# Patient Record
Sex: Female | Born: 1945
Health system: Southern US, Community
[De-identification: ages and names within clinical notes are randomized; demographics above are authoritative.]

## PROBLEM LIST (undated history)

## (undated) DIAGNOSIS — H9190 Unspecified hearing loss, unspecified ear: Secondary | ICD-10-CM

## (undated) DIAGNOSIS — M8580 Other specified disorders of bone density and structure, unspecified site: Secondary | ICD-10-CM

## (undated) DIAGNOSIS — K219 Gastro-esophageal reflux disease without esophagitis: Secondary | ICD-10-CM

## (undated) DIAGNOSIS — D126 Benign neoplasm of colon, unspecified: Secondary | ICD-10-CM

## (undated) DIAGNOSIS — E785 Hyperlipidemia, unspecified: Secondary | ICD-10-CM

## (undated) DIAGNOSIS — I10 Essential (primary) hypertension: Secondary | ICD-10-CM

## (undated) DIAGNOSIS — F329 Major depressive disorder, single episode, unspecified: Secondary | ICD-10-CM

## (undated) DIAGNOSIS — R159 Full incontinence of feces: Secondary | ICD-10-CM

## (undated) DIAGNOSIS — R519 Headache, unspecified: Secondary | ICD-10-CM

## (undated) DIAGNOSIS — R112 Nausea with vomiting, unspecified: Secondary | ICD-10-CM

## (undated) DIAGNOSIS — F32A Depression, unspecified: Secondary | ICD-10-CM

## (undated) DIAGNOSIS — R109 Unspecified abdominal pain: Secondary | ICD-10-CM

## (undated) DIAGNOSIS — R51 Headache: Secondary | ICD-10-CM

## (undated) DIAGNOSIS — Z8379 Family history of other diseases of the digestive system: Secondary | ICD-10-CM

## (undated) DIAGNOSIS — Z9889 Other specified postprocedural states: Secondary | ICD-10-CM

## (undated) DIAGNOSIS — K648 Other hemorrhoids: Secondary | ICD-10-CM

## (undated) HISTORY — DX: Unspecified abdominal pain: R10.9

## (undated) HISTORY — DX: Other specified disorders of bone density and structure, unspecified site: M85.80

## (undated) HISTORY — DX: Essential (primary) hypertension: I10

## (undated) HISTORY — DX: Major depressive disorder, single episode, unspecified: F32.9

## (undated) HISTORY — DX: Hyperlipidemia, unspecified: E78.5

## (undated) HISTORY — DX: Family history of other diseases of the digestive system: Z83.79

## (undated) HISTORY — DX: Depression, unspecified: F32.A

## (undated) HISTORY — DX: Benign neoplasm of colon, unspecified: D12.6

## (undated) HISTORY — DX: Gastro-esophageal reflux disease without esophagitis: K21.9

## (undated) HISTORY — DX: Unspecified hearing loss, unspecified ear: H91.90

## (undated) HISTORY — PX: BREAST EXCISIONAL BIOPSY: SUR124

## (undated) HISTORY — DX: Other hemorrhoids: K64.8

## (undated) HISTORY — DX: Headache, unspecified: R51.9

## (undated) HISTORY — DX: Other specified postprocedural states: Z98.890

## (undated) HISTORY — DX: Nausea with vomiting, unspecified: R11.2

## (undated) HISTORY — DX: Full incontinence of feces: R15.9

## (undated) HISTORY — DX: Headache: R51

---
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
## 1974-06-22 HISTORY — PX: TUBAL LIGATION: SHX77

## 1984-06-22 DIAGNOSIS — Z9889 Other specified postprocedural states: Secondary | ICD-10-CM

## 1984-06-22 DIAGNOSIS — R112 Nausea with vomiting, unspecified: Secondary | ICD-10-CM

## 1984-06-22 HISTORY — DX: Nausea with vomiting, unspecified: R11.2

## 1984-06-22 HISTORY — DX: Other specified postprocedural states: Z98.890

## 1987-06-23 HISTORY — PX: ABDOMINAL HYSTERECTOMY: SHX81

## 1996-06-22 HISTORY — PX: ROTATOR CUFF REPAIR: SHX139

## 1999-06-10 ENCOUNTER — Encounter: Admission: RE | Admit: 1999-06-10 | Discharge: 1999-06-10 | Payer: Self-pay | Admitting: *Deleted

## 1999-06-10 ENCOUNTER — Encounter: Payer: Self-pay | Admitting: *Deleted

## 1999-08-24 ENCOUNTER — Inpatient Hospital Stay (HOSPITAL_COMMUNITY): Admission: EM | Admit: 1999-08-24 | Discharge: 1999-08-31 | Payer: Self-pay | Admitting: Emergency Medicine

## 1999-08-24 ENCOUNTER — Encounter: Payer: Self-pay | Admitting: Emergency Medicine

## 1999-08-24 HISTORY — PX: EXPLORATORY LAPAROTOMY: SUR591

## 2000-02-13 ENCOUNTER — Other Ambulatory Visit: Admission: RE | Admit: 2000-02-13 | Discharge: 2000-02-13 | Payer: Self-pay | Admitting: *Deleted

## 2000-06-11 ENCOUNTER — Encounter: Payer: Self-pay | Admitting: *Deleted

## 2000-06-11 ENCOUNTER — Encounter: Admission: RE | Admit: 2000-06-11 | Discharge: 2000-06-11 | Payer: Self-pay | Admitting: *Deleted

## 2000-11-05 ENCOUNTER — Encounter (INDEPENDENT_AMBULATORY_CARE_PROVIDER_SITE_OTHER): Payer: Self-pay | Admitting: *Deleted

## 2000-11-05 ENCOUNTER — Other Ambulatory Visit: Admission: RE | Admit: 2000-11-05 | Discharge: 2000-11-05 | Payer: Self-pay | Admitting: Internal Medicine

## 2001-04-13 ENCOUNTER — Encounter: Payer: Self-pay | Admitting: Family Medicine

## 2001-04-13 ENCOUNTER — Encounter: Admission: RE | Admit: 2001-04-13 | Discharge: 2001-04-13 | Payer: Self-pay | Admitting: Family Medicine

## 2001-06-30 ENCOUNTER — Encounter: Payer: Self-pay | Admitting: *Deleted

## 2001-06-30 ENCOUNTER — Encounter: Admission: RE | Admit: 2001-06-30 | Discharge: 2001-06-30 | Payer: Self-pay | Admitting: *Deleted

## 2002-09-12 ENCOUNTER — Encounter: Payer: Self-pay | Admitting: Family Medicine

## 2002-09-12 ENCOUNTER — Encounter: Admission: RE | Admit: 2002-09-12 | Discharge: 2002-09-12 | Payer: Self-pay | Admitting: Family Medicine

## 2003-11-15 ENCOUNTER — Encounter: Admission: RE | Admit: 2003-11-15 | Discharge: 2003-11-15 | Payer: Self-pay | Admitting: Family Medicine

## 2004-11-19 ENCOUNTER — Encounter: Admission: RE | Admit: 2004-11-19 | Discharge: 2004-11-19 | Payer: Self-pay | Admitting: Family Medicine

## 2005-02-02 ENCOUNTER — Encounter: Admission: RE | Admit: 2005-02-02 | Discharge: 2005-02-02 | Payer: Self-pay | Admitting: Family Medicine

## 2005-02-26 ENCOUNTER — Ambulatory Visit (HOSPITAL_COMMUNITY): Admission: RE | Admit: 2005-02-26 | Discharge: 2005-02-26 | Payer: Self-pay | Admitting: Interventional Cardiology

## 2005-06-22 HISTORY — PX: CHOLECYSTECTOMY: SHX55

## 2005-12-17 ENCOUNTER — Encounter: Admission: RE | Admit: 2005-12-17 | Discharge: 2005-12-17 | Payer: Self-pay | Admitting: Family Medicine

## 2006-12-06 ENCOUNTER — Ambulatory Visit: Payer: Self-pay | Admitting: Internal Medicine

## 2007-01-19 ENCOUNTER — Ambulatory Visit: Payer: Self-pay | Admitting: Internal Medicine

## 2007-03-25 ENCOUNTER — Encounter: Admission: RE | Admit: 2007-03-25 | Discharge: 2007-03-25 | Payer: Self-pay | Admitting: Family Medicine

## 2007-09-22 ENCOUNTER — Encounter: Admission: RE | Admit: 2007-09-22 | Discharge: 2007-09-22 | Payer: Self-pay | Admitting: Family Medicine

## 2007-11-07 DIAGNOSIS — E785 Hyperlipidemia, unspecified: Secondary | ICD-10-CM | POA: Insufficient documentation

## 2007-11-07 DIAGNOSIS — I10 Essential (primary) hypertension: Secondary | ICD-10-CM | POA: Insufficient documentation

## 2007-11-07 DIAGNOSIS — Z9189 Other specified personal risk factors, not elsewhere classified: Secondary | ICD-10-CM | POA: Insufficient documentation

## 2008-03-29 ENCOUNTER — Encounter: Admission: RE | Admit: 2008-03-29 | Discharge: 2008-03-29 | Payer: Self-pay | Admitting: Family Medicine

## 2008-08-06 ENCOUNTER — Telehealth: Payer: Self-pay | Admitting: Internal Medicine

## 2008-08-07 ENCOUNTER — Ambulatory Visit: Payer: Self-pay | Admitting: Gastroenterology

## 2008-08-07 DIAGNOSIS — K5730 Diverticulosis of large intestine without perforation or abscess without bleeding: Secondary | ICD-10-CM | POA: Insufficient documentation

## 2008-08-07 DIAGNOSIS — R1319 Other dysphagia: Secondary | ICD-10-CM | POA: Insufficient documentation

## 2008-08-07 DIAGNOSIS — Z860100 Personal history of colon polyps, unspecified: Secondary | ICD-10-CM | POA: Insufficient documentation

## 2008-08-07 DIAGNOSIS — K219 Gastro-esophageal reflux disease without esophagitis: Secondary | ICD-10-CM | POA: Insufficient documentation

## 2008-08-07 DIAGNOSIS — R110 Nausea: Secondary | ICD-10-CM | POA: Insufficient documentation

## 2008-08-10 ENCOUNTER — Telehealth: Payer: Self-pay | Admitting: Internal Medicine

## 2008-08-14 ENCOUNTER — Ambulatory Visit: Payer: Self-pay | Admitting: Internal Medicine

## 2008-08-24 ENCOUNTER — Ambulatory Visit (HOSPITAL_COMMUNITY): Admission: RE | Admit: 2008-08-24 | Discharge: 2008-08-24 | Payer: Self-pay | Admitting: Interventional Cardiology

## 2008-10-08 ENCOUNTER — Ambulatory Visit (HOSPITAL_COMMUNITY): Admission: RE | Admit: 2008-10-08 | Discharge: 2008-10-08 | Payer: Self-pay | Admitting: Gastroenterology

## 2008-11-08 ENCOUNTER — Ambulatory Visit (HOSPITAL_COMMUNITY): Admission: RE | Admit: 2008-11-08 | Discharge: 2008-11-08 | Payer: Self-pay | Admitting: General Surgery

## 2008-11-08 ENCOUNTER — Encounter (INDEPENDENT_AMBULATORY_CARE_PROVIDER_SITE_OTHER): Payer: Self-pay | Admitting: General Surgery

## 2009-01-17 ENCOUNTER — Encounter: Admission: RE | Admit: 2009-01-17 | Discharge: 2009-01-17 | Payer: Self-pay | Admitting: Gastroenterology

## 2009-01-31 ENCOUNTER — Ambulatory Visit (HOSPITAL_COMMUNITY): Admission: RE | Admit: 2009-01-31 | Discharge: 2009-01-31 | Payer: Self-pay | Admitting: Gastroenterology

## 2009-04-10 ENCOUNTER — Encounter: Admission: RE | Admit: 2009-04-10 | Discharge: 2009-04-10 | Payer: Self-pay | Admitting: Family Medicine

## 2010-04-14 ENCOUNTER — Encounter: Admission: RE | Admit: 2010-04-14 | Discharge: 2010-04-14 | Payer: Self-pay | Admitting: Family Medicine

## 2010-04-22 ENCOUNTER — Encounter: Admission: RE | Admit: 2010-04-22 | Discharge: 2010-04-22 | Payer: Self-pay | Admitting: Family Medicine

## 2010-07-13 ENCOUNTER — Encounter: Payer: Self-pay | Admitting: Interventional Cardiology

## 2010-09-30 LAB — CBC
HCT: 38.4 % (ref 36.0–46.0)
MCV: 98.5 fL (ref 78.0–100.0)
RBC: 3.9 MIL/uL (ref 3.87–5.11)
WBC: 6.7 10*3/uL (ref 4.0–10.5)

## 2010-09-30 LAB — DIFFERENTIAL
Eosinophils Absolute: 0.1 10*3/uL (ref 0.0–0.7)
Eosinophils Relative: 2 % (ref 0–5)
Lymphs Abs: 1.5 10*3/uL (ref 0.7–4.0)
Monocytes Relative: 4 % (ref 3–12)

## 2010-09-30 LAB — BASIC METABOLIC PANEL
Chloride: 110 mEq/L (ref 96–112)
GFR calc Af Amer: >60 mL/min (ref >60)
Potassium: 4.3 mEq/L (ref 3.5–5.1)

## 2010-10-01 ENCOUNTER — Other Ambulatory Visit: Payer: Self-pay | Admitting: Family Medicine

## 2010-10-01 DIAGNOSIS — Z09 Encounter for follow-up examination after completed treatment for conditions other than malignant neoplasm: Secondary | ICD-10-CM

## 2010-10-23 ENCOUNTER — Ambulatory Visit
Admission: RE | Admit: 2010-10-23 | Discharge: 2010-10-23 | Disposition: A | Discharge status: Home / Self Care | Payer: Medicare HMO | Source: Ambulatory Visit | Attending: Family Medicine | Admitting: Family Medicine

## 2010-10-23 ENCOUNTER — Other Ambulatory Visit: Payer: Self-pay | Admitting: Family Medicine

## 2010-10-23 ENCOUNTER — Other Ambulatory Visit: Payer: Self-pay | Admitting: Diagnostic Radiology

## 2010-10-23 DIAGNOSIS — Z09 Encounter for follow-up examination after completed treatment for conditions other than malignant neoplasm: Secondary | ICD-10-CM

## 2010-11-04 NOTE — Op Note (Signed)
Rachael Webster, Rachael Webster             ACCOUNT NO.:  [id number]   MEDICAL RECORD NO.:  [id number]          PATIENT TYPE:  AMB   LOCATION:  DAY                          FACILITY:  [facility]   PHYSICIAN:  Lennie Muckle, MD      DATE OF BIRTH:  February 26, 1946   DATE OF PROCEDURE:  11/08/2008  DATE OF DISCHARGE:                               OPERATIVE REPORT   PREOPERATIVE DIAGNOSIS:  Biliary dyskinesia.   POSTOPERATIVE DIAGNOSIS:  Biliary dyskinesia.   PROCEDURE:  Laparoscopic cholecystectomy.   SURGEON:  Bertram Savin, M.D.   ASSISTANT:  Anselm Pancoast. Zachery Dakins, M.D.   FINDINGS:  Adhesions to abdominal wall, adhesions to the gallbladder.   INDICATIONS FOR PROCEDURE:  Ms. Harms is a 65 year old female I had  seen for evaluation of epigastric and right upper quadrant pain.  She  had a previous history of a perforated ulcer.  She had endoscopy that  was normal.  Ultrasound was normal, but she did have a HIDA scan, with  an ejection fraction of approximately 16%.  Her symptoms did seem  consistent with biliary dyskinesia.  I had talked with Ms. Schooley about  a laparoscopic cholecystectomy, with a slight risk of an open procedure,  due to a previous perforated ulcer.  An informed consent was obtained  prior to procedure.   DESCRIPTION OF PROCEDURE:  Ms. Myhre was identified in the  preoperative holding area. All questions were answered.  She did receive  2 grams of cefoxitin.  She was then taken to the operating room.  Once  in the operating room, placed in the supine position.  Spontaneous  compression devices were applied to the bilateral lower extremity.  She  was placed under general endotracheal anesthesia.  Her abdomen was  prepped and draped in the usual sterile fashion.  A time out for the  procedure indicating the procedure and the patient was performed.  An  incision was placed in the right upper quadrant.  Using a 5 mm trocar  and the Opti-Vu, I placed the camera into the abdominal  cavity.  All  layers of abdominal wall were visualized upon entry.  I inspected the  abdomen.  There seemed to be no evidence of injury upon placement of the  trocar.  There were adhesions to the abdominal wall at the midline and  just inferior to the port placement.  The umbilical area was free of  adhesions.  We placed a 5 mm trocar at the umbilical region. There were  also adhesions at the epigastric region.  These were partially taken  down with laparoscopic scissors.  Total time was approximately 10  minutes.  A 5 mm trocar was placed in the right upper quadrant under  visualization with the camera and an 11 mm trocar was placed in the  epigastric region.  The gallbladder was identified in normal anatomic  position.  There were omental adhesions to the gallbladder.  I dissected  with blunt dissection and sharp dissection.  I was able to obtain a  critical view of the cystic duct and the artery.  I clipped and divided  the cystic duct.  I carried the dissection superiorly and clipped and  divided the cystic artery.  The gallbladder was placed in an EndoCatch  bag.  A small amount of bile spillage.  I placed two Ray-Tec's in the  abdominal cavity around the area of dissetion.  After removal of the  gallbladder, I removed the Ray-Tec's and inspected the liver.  There was  no evidence of bleeding.  No evidence of bile leakage.  The clips were  in place on the cystic duct.  I then closed the epigastric port after  removing the trocar with a 0 Vicryl suture.  The pneumoperitoneum was  released after final inspection of the abdomen and revealed no bleeding,  no injury.  The trocars were removed.  The skin was closed with 4-0  Monocryl.  Dermabond was placed, final dressing.   The patient was extubated and transferred to the post-anesthesia care  unit in stable condition.  She will be monitored overnight and likely  discharged home tomorrow.      Lennie Muckle, MD  Electronically  Signed     ALA/MEDQ  D:  11/08/2008  T:  11/08/2008  Job:  [id number]   cc:   Ursula Beath, MD  Fax: [phone]   Danise Edge, M.D.  Fax: [phone]

## 2010-11-04 NOTE — Assessment & Plan Note (Signed)
[facility]                         GASTROENTEROLOGY OFFICE NOTE   KALEENA, CORROW                    MRN:          [id number]  DATE:12/06/2006                            DOB:          [birth year]    The patient is self referred.   REASON FOR CONSULTATION:  Change in bowel habits and surveillance  colonoscopy.   HISTORY:  This is a 64 year old white female with a history of  hypertension, dyslipidemia, and chronic headaches.  She also has a  history of perforated gastric ulcer for which she is status post  surgical repair.  Other surgeries include hysterectomy, rotator cuff  repair, tubal ligation, tonsillectomy, and breast surgery.  She also has  a history of adenomatous colon polyps.  Her index colonoscopy was  performed in 2002.  Followup was performed in 2004 with diminutive polyp  only.  Followup in 3 years recommended.  A letter was sent out last  year.  The patient was somewhat concerned about 2-3 weeks ago when she  had change in her bowel movements.  She reports that they were more  frequent and somewhat loose or less formed.  There was no associated  nausea and vomiting, abdominal pain or bleeding.  Her weight has been  stable.  Her bowel habits have now reverted back to normal, without  incident.  She denies any change in diet or other problems at that time.   PAST MEDICAL HISTORY:  As discussed above.   FAMILY HISTORY:  Unknown.  The patient is adopted.   ALLERGIES:  The patient is intolerant to ASPIRIN, IBUPROFEN, and ELAVIL.   CURRENT MEDICATIONS:  1. Fosamax 70 mg weekly.  2. Amlodipine 10 mg daily.  3. Omeprazole 20 mg daily.  4. Lescol XR 80 mg daily.  5. Zetia 10 mg daily.  6. Doxepin 150 mg at night.  7. Effexor 150 mg daily.  8. Hydrocodone p.r.n.  9. Multivitamin.  10.Calcium, vitamin D, and fish oil.  11.She also takes Ambien p.r.n.   SOCIAL HISTORY:  The patient continues to work as a Psychologist, forensic for  [employer].  She is married with 2 children, does not smoke or use  alcohol.   PHYSICAL EXAMINATION:  Well appearing female in no acute distress.  Blood pressure is 118/72, heart rate is 80, weight is 161 pounds.  She  is 5 feet 6 inches in height.  HEENT:  Sclerae are anicteric, conjunctiva are pink, oral mucosa intact.  LUNGS:  Clear.  HEART:  Regular.  ABDOMEN:  Soft, without tenderness, mass or hernia.  EXTREMITIES:  Without edema.   IMPRESSION:  1. Nonspecific transient change in bowel habits.  No worrisome      features.  Reassurance provided.  2. History of adenomatous colon polyps due for surveillance.  3. History of diverticulosis.   RECOMMENDATIONS:  Schedule colonoscopy, with polypectomy if needed.  Ashby Dawes of the procedure as well as the risks, benefits and alternatives  were reviewed.  She understood and agreed to proceed.     Wilhemina Bonito. Marina Goodell, MD  Electronically Signed    JNP/MedQ  DD: 12/06/2006  DT:  12/06/2006  Job #: [id number]   cc:   Talmadge Coventry, M.D.

## 2010-11-07 NOTE — Discharge Summary (Signed)
[place]. [facility]  Patient:    Rachael Webster, Rachael Webster                      MRN: [id number] Adm. Date:  [id number] Disc. Date: [id number] Attending:  Brandy Hale CC:         Clabe Seal. Meryl Crutch, M.D.             Talmadge Coventry, M.D.             Leatha Gilding. Mezer, M.D.                           Discharge Summary  FINAL DIAGNOSES: 1. Perforated duodenal ulcer. 2. Migraine headaches.  OPERATION PERFORMED:  Exploratory laparotomy and Cheree Ditto patch closure of perforated duodenal ulcer.  BRIEF HISTORY:  This is a 65 year old, white female with chronic migraine headaches on multiple medications.  She takes dexamethasone, aspirin, Indocin, doxepin, Effexor, Norvasc, and Fosamax.  She had a sudden onset of epigastric pain at 12:30 p.m. on the day of admission, and this became more severe.  She came to the emergency room and was evaluated by the emergency department physician.  The emergency department physician obtained a CT scan, which showed free intraperitoneal air and probable contrast in the free peritoneal cavity.  A normal CT was noted.  I was asked to see the patient at that time.  PHYSICAL EXAMINATION:  GENERAL:  Pale, alert, and cooperative white female.  VITAL SIGNS:  Temperature 98.4, heart rate 87, respiratory rate 16, and blood pressure 124/83.  HEENT:  Sclera nonicteric.  Extraocular movements intact.  NECK:  Supple and nontender.  No mass.  No crepitants.  No swelling.  LUNGS:  Clear to auscultation.  HEART:  Regular rate and rhythm.  No murmur.  ABDOMEN:  Silent.  She was extremely tender in the epigastrium with involuntary guarding and rebound tenderness.  The lower abdomen was fairly soft.  There was no mass or hernia.  A well-healed Pfannenstiel incision.  ADMISSION LABORATORY DATA:  Hemoglobin 15.7 and white count 19,600.  Glucose 180, sodium 136, potassium 3.5, and BUN 20.  Amylase 106 and lipase 88.  HOSPITAL COURSE:   Following evaluation in the emergency room, the patient received IV fluid resuscitation and broad-spectrum antibiotics and was taken to the operating room.  She was found to have a perforated duodenal ulcer. This was repaired with a Cheree Ditto patch closure, and the abdomen was irrigated.  Postoperatively, she did very well.  She was maintained on broad-spectrum antibiotics.  She had an ileus for several days, but that ultimately resolved. She progressed in her activities, and ultimately began to tolerate a diet on August 29, 1999.  She progressed her diet and activities slowly thereafter, and was able to be discharged on August 31, 1999.  At that time, she was afebrile, tolerating diet, had had bowel movements, and her wounds were healing uneventfully without signs of infection.  DISPOSITION:  She was discharged home.  DISCHARGE MEDICATIONS:  She was given a prescription for analgesics for pain.  DISCHARGE INSTRUCTIONS:  She was told not to take aspirin, Indocin, or dexamethasone again.  FOLLOWUP:  She was to follow up with Dr. Clabe Seal. Adelman regarding alternative forms of treatment for her migraine headaches.  She was asked to return to see me in the office in 2-3 weeks. DD:  09/19/99 TD:  09/20/99 Job: [id number]

## 2010-11-07 NOTE — Op Note (Signed)
[place]. [facility]  Patient:    Rachael Webster, Rachael Webster                      MRN: [id number] Proc. Date: 08/24/99 Adm. Date:  [id number] Attending:  Brandy Hale CC:         Talmadge Coventry, M.D.             Clabe Seal. Meryl Crutch, M.D.             Leatha Gilding. Mezer, M.D.                           Operative Report  PREOPERATIVE DIAGNOSIS:  Perforated duodenal ulcer.  POSTOPERATIVE DIAGNOSIS:  Perforated duodenal ulcer.  OPERATION PERFORMED:  Exploratory laparotomy, Cheree Ditto patch closure of perforated duodenal ulcer.  SURGEON:  Angelia Mould. Derrell Lolling, M.D.  ANESTHESIA:  OPERATIVE INDICATIONS:  This is a 65 year old white female who has been treated for migraine headaches for many years.  She has been taking aspirin and Indocin daily. She recently began dexamethasone for the past three days.  She developed the sudden onset of epigastric pain today, which had gotten progressively more severe. She came to the emergency room and x-rays showed free intraperitoneal air.  She is brought to the operating room urgently for exploratory laparotomy.  OPERATIVE FINDINGS:  The patient had a perforated duodenal ulcer of the anterior wall.  It seemed to be in the region of the pyloric channel.  There was a lot of inflammation.  The perforation hole itself was only about 3 mm in diameter. There was no signs of malignancy.  There was probably at least 1500-2000 cc of dirty ish water appearing fluid in the abdomen and pelvis.  There was no foul odor.  The fluid was cultured.  DESCRIPTION OF PROCEDURE:  Following the induction of general endotracheal anesthesia, the patients abdomen was prepped and draped in a sterile fashion. Upper midline laparotomy was performed, and the abdomen was entered and explored with the findings as described above.  We cultured the peritoneal fluid.  We evacuated much of the peritoneal fluid. e identified the ulcer.  We placed four  sutures of 2-0 silk above and below the ulcer perforation.  We brought a tongue of omentum up from the transverse colon, and placed this over the perforation.  We then tied all of the four silk sutures. his provided a very good patch closure of the ulcer.  There was no bleeding.  We then irrigated the subphrenic spaces, subhepatic space, pericolic gutters, small bowel, and pelvis very copiously with about 8 L or 10 L of warm saline until all of the fluid returned very clear, and all of the exudate had been removed.  We returned all of the small bowel and the colon to its anatomic position.  The midline fascia was closed with a running suture of #1 PDS.  The skin was closed  with skin staples and we placed Telfa wicks in between several of the staples to promote drainage.  A clean bandage was placed and the patient was taken to the recovery room in stable condition.  Estimated blood loss was about 100 cc.  Complications none. Sponge, needle, and instrument counts were correct.  An epidural catheter is planned by  Onalee Hua A. Ivin Booty, M.D., and will be dictated separately. DD:  08/24/99 TD:  08/25/99 Job: [id number]

## 2010-11-07 NOTE — Procedures (Signed)
[place]. [facility]  Patient:    Rachael Webster, Rachael Webster                      MRN: [id number] Proc. Date: 08/25/99 Attending:  Cliffton Asters. Ivin Booty, M.D. CC:         Anesthesia Department                           Procedure Report  PROCEDURE:  Insertion of epidural catheter for postoperative analgesia.  Ms. Cervenka was brought to the operating room tonight by Dr. Claud Kelp for  exploratory laparotomy due to a perforated viscus.  Prior to the procedure, I explained to her the risks and benefits of epidural analgesia and she accepted these and wished to proceed.  At the conclusion of the operation and while still in the right lateral position, Dr. Derrell Lolling and I consulted and felt that epidural analgesia would indeed be the  best choice for her for pain relief.  Her back was prepped in a sterile fashion. At the L1-2 interspace, a 17-gauge Tuohy needle was passed to a loss of resistance to normal saline, 3 cc.  There was a negative aspiration of blood or CSF and an  18-gauge epidural catheter was passed through the needle to a depth of 4 cm below the end of the needle.  The needle was removed and the catheter was secured.  There was a negative aspiration of blood or CSF from the catheter and a bolus injection of 100 mcg of fentanyl in a solution of 14 cc of 0.5% lidocaine was given through the catheter.  The patient was turned supine, suctioned, but failed to breath adequately in the operating room.  She was transferred to the PACU with the endotracheal tube in place.  On arrival there, she became more responsive and opened her eyes.  We were able to communicate with her and she asked that the endotracheal tube be removed. This was done and she tolerated having it removed quite well.  She was breathing well before removing the tube.  She will be followed by the anesthesia department for several days until removal of her epidural catheter. DD:   08/25/99 TD:  08/25/99 Job: [id number]

## 2010-11-07 NOTE — H&P (Signed)
[place]. [facility]  Patient:    Rachael Webster, Rachael Webster                      MRN: [id number] Adm. Date:  08/24/99 Attending:  Angelia Mould. Derrell Lolling, M.D. Dictator:   Angelia Mould. Derrell Lolling, M.D. CC:         Talmadge Coventry, M.D.             Clabe Seal. Meryl Crutch, M.D.             Leatha Gilding. Mezer, M.D.                         History and Physical  CHIEF COMPLAINT:  Abdominal pain.  HISTORY OF PRESENT ILLNESS:  This is a 65 year old white female who was feeling  well this morning.  At 12:30 p.m. today she developed the sudden onset of epigastric pain.  This got a little bit better, and then got much worse, and she came to the emergency room by ambulance with her husband.  She denies nausea or  vomiting.  She has been having normal bowel movements lately.  Denies constipation or diarrhea.  Denies melena or hematochezia.  States she had a negative stool Hemoccult recently.  PAST MEDICAL HISTORY: 1. Migraine headaches. 2. Elevated cholesterol. 3. History of total abdominal hysterectomy and bilateral salpingo-oophorectomy. 4. History of right rotator cuff surgery. 5. History of tubal ligation prior to her hysterectomy.  CURRENT MEDICATIONS: 1. Indocin 0.25 mg b.i.d. for years (discontinued on 08/22/99). 2. One aspirin per day. 3. Dexamethasone 16 mg on March 2, 16 mg on March 3, and 4 mg today. 4. Fosamax 0.7 mg once a week on Saturday. 5. Norvasc 5 mg p.o. b.i.d. 6. Effexor 75 mg q.a.m. 7. Effexor 37.5 mg at lunch. 8. Doxepin 100 mg q.h.s.  ALLERGIES:  ELAVIL.  She also has had a lot of nausea after anesthesia in the past.  FAMILY HISTORY:  The patient is adopted.  SOCIAL HISTORY:  The patient is married, lives in [place], she has two children. She is married to a gentleman who works at [employer].  She denies the use of alcohol or tobacco.  REVIEW OF SYSTEMS:  All systems are reviewed and are negative except as described above.  PHYSICAL  EXAMINATION:  GENERAL:  Pleasant, slightly overweight, middle aged white female in moderately  severe distress.  She is cooperative and oriented.  VITAL SIGNS:  Temperature 98.4, blood pressure 124/83, pulse 87, respirations 16, oxygen saturation 98% on room air.  HEENT:  Sclera clear.  Extraocular movements intact.  Nose and external auditory canals are clear.  NECK:  Supple, nontender, no mass, no bruit, no adenopathy.  LUNGS:  Clear to auscultation.  HEART:  Regular rate and rhythm, no murmur.  ABDOMEN:  Slightly distended.  Minimal bowel sounds.  Exquisitely tender in the  epigastrium, more so in the left upper quadrant and the right upper quadrant, and minimally tender in the lower abdomen.  There is no palpable mass and no hernia. There is a well-healed Pfannenstiel incision.  GENITALIA:  Normal female genitalia.  No inguinal mass or adenopathy.  EXTREMITIES:  No edema, excellent pulses.  NEUROLOGIC:  Grossly within normal limits.  IMPRESSION: 1. Perforated viscus, suspect perforated peptic ulcer. 2. Migraine headaches. 3. Status post total abdominal hysterectomy and bilateral salpingo-oophorectomy.  PLAN: 1. The patient will be taken to the operating room emergently for exploratory  laparotomy and closure of her perforation. 2. I have discussed the indications and details of this procedure with her and er    husband.  I have discussed the differential diagnosis, including perforations of    other parts of the upper or lower gastrointestinal tract.  She has been told  that she will probably have an ulcer repair, but if this is a perforated colon    she may need a colostomy.  At this time all of her questions have been answered.    She is in complete agreement with this plan. DD:  08/24/99 TD:  08/24/99 Job: [id number]

## 2011-03-13 ENCOUNTER — Other Ambulatory Visit: Payer: Self-pay | Admitting: Family Medicine

## 2011-03-13 DIAGNOSIS — Z1231 Encounter for screening mammogram for malignant neoplasm of breast: Secondary | ICD-10-CM

## 2011-04-16 ENCOUNTER — Ambulatory Visit
Admission: RE | Admit: 2011-04-16 | Discharge: 2011-04-16 | Disposition: A | Discharge status: Home / Self Care | Payer: Medicare HMO | Source: Ambulatory Visit | Attending: Family Medicine | Admitting: Family Medicine

## 2011-04-16 DIAGNOSIS — Z1231 Encounter for screening mammogram for malignant neoplasm of breast: Secondary | ICD-10-CM

## 2012-01-29 ENCOUNTER — Encounter: Payer: Self-pay | Admitting: Internal Medicine

## 2012-02-02 ENCOUNTER — Encounter: Payer: Self-pay | Admitting: Internal Medicine

## 2012-03-07 ENCOUNTER — Ambulatory Visit (AMBULATORY_SURGERY_CENTER): Payer: Medicare Other | Admitting: *Deleted

## 2012-03-07 ENCOUNTER — Encounter: Payer: Self-pay | Admitting: Internal Medicine

## 2012-03-07 VITALS — Ht 65.5 in | Wt 153.5 lb

## 2012-03-07 DIAGNOSIS — Z1211 Encounter for screening for malignant neoplasm of colon: Secondary | ICD-10-CM

## 2012-03-07 MED ORDER — MOVIPREP 100 G PO SOLR: ORAL | Status: DC

## 2012-03-09 ENCOUNTER — Other Ambulatory Visit: Payer: Self-pay | Admitting: Family Medicine

## 2012-03-09 DIAGNOSIS — Z1231 Encounter for screening mammogram for malignant neoplasm of breast: Secondary | ICD-10-CM

## 2012-03-21 ENCOUNTER — Encounter: Payer: Self-pay | Admitting: Internal Medicine

## 2012-03-21 ENCOUNTER — Ambulatory Visit (AMBULATORY_SURGERY_CENTER): Payer: Medicare Other | Admitting: Internal Medicine

## 2012-03-21 VITALS — BP 147/72 | HR 56 | Temp 97.0°F | Resp 22 | Ht 65.5 in | Wt 153.0 lb

## 2012-03-21 DIAGNOSIS — Z1211 Encounter for screening for malignant neoplasm of colon: Secondary | ICD-10-CM

## 2012-03-21 DIAGNOSIS — D126 Benign neoplasm of colon, unspecified: Secondary | ICD-10-CM

## 2012-03-21 DIAGNOSIS — Z86010 Personal history of colonic polyps: Secondary | ICD-10-CM

## 2012-03-21 MED ORDER — SODIUM CHLORIDE 0.9 % IV SOLN: 500.0000 mL | INTRAVENOUS | Status: DC

## 2012-03-21 NOTE — Progress Notes (Signed)
Patient did not experience any of the following events: a burn prior to discharge; a fall within the facility; wrong site/side/patient/procedure/implant event; or a hospital transfer or hospital admission upon discharge from the facility. (G8907) Patient did not have preoperative order for IV antibiotic SSI prophylaxis. (G8918)  

## 2012-03-21 NOTE — Op Note (Signed)
[facility] [address] [place], [address]   COLONOSCOPY PROCEDURE REPORT  PATIENT: Rachael, Webster  MR#: [id number] BIRTHDATE: Aug 26, 1945 , 66  yrs. old GENDER: Female ENDOSCOPIST: Roxy Cedar, MD REFERRED [id number] Program Recall PROCEDURE DATE:  03/21/2012 PROCEDURE:   Colonoscopy with snare polypectomy ASA CLASS:   Class II INDICATIONS:patient's personal history of adenomatous colon polyps.  MEDICATIONS: MAC sedation, administered by CRNA and propofol (Diprivan) 150mg  IV  DESCRIPTION OF PROCEDURE:   After the risks benefits and alternatives of the procedure were thoroughly explained, informed consent was obtained.  A digital rectal exam revealed no abnormalities of the rectum.   The LB CF-[id number]  endoscope was introduced through the anus and advanced to the cecum, which was identified by both the appendix and ileocecal valve. No adverse events experienced.   The quality of the prep was good, using MoviPrep  The instrument was then slowly withdrawn as the colon was fully examined.      COLON FINDINGS: Three sessile polyps ranging between 3-59mm in size were found at the cecum (3mm) and in the ascending colon (6,38mm). Polypectomies were performed with a cold snare.  The resection was complete and the polyp tissue was completely retrieved.   Moderate diverticulosis was noted in the sigmoid colon.  Retroflexed views revealed internal hemorrhoids. The time to cecum=3 minutes 11 seconds.  Withdrawal time=12 minutes 30 seconds.  The scope was withdrawn and the procedure completed. COMPLICATIONS: There were no complications.  ENDOSCOPIC IMPRESSION: 1.   Three sessile polyps ranging between 3-37mm in size were found at the cecum and in the ascending colon; polypectomy was performed with a cold snare 2.   Moderate diverticulosis was noted in the sigmoid colon  RECOMMENDATIONS: 1. Follow up colonoscopy in 5 years   eSigned:  Roxy Cedar, MD 03/21/2012 9:32 AM   cc: Maurice Small, MD and The Patient   PATIENT NAME:  Rachael, Webster MR#: [id number]

## 2012-03-21 NOTE — Patient Instructions (Addendum)
YOU HAD AN ENDOSCOPIC PROCEDURE TODAY AT THE [facility]: Refer to the procedure report that was given to you for any specific questions about what was found during the examination.  If the procedure report does not answer your questions, please call your gastroenterologist to clarify.  If you requested that your care partner not be given the details of your procedure findings, then the procedure report has been included in a sealed envelope for you to review at your convenience later.  YOU SHOULD EXPECT: Some feelings of bloating in the abdomen. Passage of more gas than usual.  Walking can help get rid of the air that was put into your GI tract during the procedure and reduce the bloating. If you had a lower endoscopy (such as a colonoscopy or flexible sigmoidoscopy) you may notice spotting of blood in your stool or on the toilet paper. If you underwent a bowel prep for your procedure, then you may not have a normal bowel movement for a few days.  DIET: Your first meal following the procedure should be a light meal and then it is ok to progress to your normal diet.  A half-sandwich or bowl of soup is an example of a good first meal.  Heavy or fried foods are harder to digest and may make you feel nauseous or bloated.  Likewise meals heavy in dairy and vegetables can cause extra gas to form and this can also increase the bloating.  Drink plenty of fluids but you should avoid alcoholic beverages for 24 hours.  ACTIVITY: Your care partner should take you home directly after the procedure.  You should plan to take it easy, moving slowly for the rest of the day.  You can resume normal activity the day after the procedure however you should NOT DRIVE or use heavy machinery for 24 hours (because of the sedation medicines used during the test).    SYMPTOMS TO REPORT IMMEDIATELY: A gastroenterologist can be reached at any hour.  During normal business hours, 8:30 AM to 5:00 PM Monday through Friday,  call [phone].  After hours and on weekends, please call the GI answering service at [phone] who will take a message and have the physician on call contact you.   Following lower endoscopy (colonoscopy or flexible sigmoidoscopy):  Excessive amounts of blood in the stool  Significant tenderness or worsening of abdominal pains  Swelling of the abdomen that is new, acute  Fever of 100F or higher  Following upper endoscopy (EGD)  Vomiting of blood or coffee ground material  New chest pain or pain under the shoulder blades  Painful or persistently difficult swallowing  New shortness of breath  Fever of 100F or higher  Black, tarry-looking stools  FOLLOW UP: If any biopsies were taken you will be contacted by phone or by letter within the next 1-3 weeks.  Call your gastroenterologist if you have not heard about the biopsies in 3 weeks.  Our staff will call the home number listed on your records the next business day following your procedure to check on you and address any questions or concerns that you may have at that time regarding the information given to you following your procedure. This is a courtesy call and so if there is no answer at the home number and we have not heard from you through the emergency physician on call, we will assume that you have returned to your regular daily activities without incident.  SIGNATURES/CONFIDENTIALITY: You and/or your care   partner have signed paperwork which will be entered into your electronic medical record.  These signatures attest to the fact that that the information above on your After Visit Summary has been reviewed and is understood.  Full responsibility of the confidentiality of this discharge information lies with you and/or your care-partner.  

## 2012-03-21 NOTE — Progress Notes (Signed)
Propofol per B Gordy Levan CRNA, all meds titrated per CRNA during procedure. See scanned intra procedure report. ewm

## 2012-03-22 ENCOUNTER — Telehealth: Payer: Self-pay | Admitting: *Deleted

## 2012-03-22 NOTE — Telephone Encounter (Signed)
Left message that we called for f/u 

## 2012-03-25 ENCOUNTER — Encounter: Payer: Self-pay | Admitting: Internal Medicine

## 2012-04-19 ENCOUNTER — Ambulatory Visit
Admission: RE | Admit: 2012-04-19 | Discharge: 2012-04-19 | Disposition: A | Discharge status: Home / Self Care | Payer: Medicare Other | Source: Ambulatory Visit | Attending: Family Medicine | Admitting: Family Medicine

## 2012-04-19 DIAGNOSIS — Z1231 Encounter for screening mammogram for malignant neoplasm of breast: Secondary | ICD-10-CM

## 2013-04-06 ENCOUNTER — Other Ambulatory Visit: Payer: Self-pay

## 2013-04-06 DIAGNOSIS — Z1231 Encounter for screening mammogram for malignant neoplasm of breast: Secondary | ICD-10-CM

## 2013-05-09 ENCOUNTER — Ambulatory Visit
Admission: RE | Admit: 2013-05-09 | Discharge: 2013-05-09 | Disposition: A | Discharge status: Home / Self Care | Payer: Medicare Other | Source: Ambulatory Visit

## 2013-05-09 DIAGNOSIS — Z1231 Encounter for screening mammogram for malignant neoplasm of breast: Secondary | ICD-10-CM

## 2014-04-09 ENCOUNTER — Other Ambulatory Visit: Payer: Self-pay

## 2014-04-09 DIAGNOSIS — Z1239 Encounter for other screening for malignant neoplasm of breast: Secondary | ICD-10-CM

## 2014-05-09 ENCOUNTER — Other Ambulatory Visit: Payer: Self-pay

## 2014-05-09 DIAGNOSIS — Z1231 Encounter for screening mammogram for malignant neoplasm of breast: Secondary | ICD-10-CM

## 2014-05-10 ENCOUNTER — Ambulatory Visit: Payer: Medicare Other

## 2014-06-07 ENCOUNTER — Ambulatory Visit
Admission: RE | Admit: 2014-06-07 | Discharge: 2014-06-07 | Disposition: A | Discharge status: Home / Self Care | Payer: Medicare Other | Source: Ambulatory Visit

## 2014-06-07 DIAGNOSIS — Z1231 Encounter for screening mammogram for malignant neoplasm of breast: Secondary | ICD-10-CM

## 2014-06-11 ENCOUNTER — Other Ambulatory Visit: Payer: Self-pay | Admitting: Family Medicine

## 2014-06-11 DIAGNOSIS — R928 Other abnormal and inconclusive findings on diagnostic imaging of breast: Secondary | ICD-10-CM

## 2014-06-22 HISTORY — PX: BREAST BIOPSY: SHX20

## 2014-06-28 ENCOUNTER — Ambulatory Visit
Admission: RE | Admit: 2014-06-28 | Discharge: 2014-06-28 | Disposition: A | Discharge status: Home / Self Care | Payer: Medicare Other | Source: Ambulatory Visit | Attending: Family Medicine | Admitting: Family Medicine

## 2014-06-28 ENCOUNTER — Other Ambulatory Visit: Payer: Self-pay | Admitting: Family Medicine

## 2014-06-28 DIAGNOSIS — R928 Other abnormal and inconclusive findings on diagnostic imaging of breast: Secondary | ICD-10-CM

## 2014-06-28 DIAGNOSIS — N6320 Unspecified lump in the left breast, unspecified quadrant: Secondary | ICD-10-CM

## 2014-07-04 ENCOUNTER — Other Ambulatory Visit: Payer: Self-pay | Admitting: Family Medicine

## 2014-07-04 DIAGNOSIS — N6320 Unspecified lump in the left breast, unspecified quadrant: Secondary | ICD-10-CM

## 2014-07-11 ENCOUNTER — Ambulatory Visit
Admission: RE | Admit: 2014-07-11 | Discharge: 2014-07-11 | Disposition: A | Discharge status: Home / Self Care | Payer: Medicare Other | Source: Ambulatory Visit | Attending: Family Medicine | Admitting: Family Medicine

## 2014-07-11 DIAGNOSIS — N6320 Unspecified lump in the left breast, unspecified quadrant: Secondary | ICD-10-CM

## 2015-05-06 ENCOUNTER — Other Ambulatory Visit: Payer: Self-pay

## 2015-05-06 DIAGNOSIS — Z1231 Encounter for screening mammogram for malignant neoplasm of breast: Secondary | ICD-10-CM

## 2015-07-02 ENCOUNTER — Ambulatory Visit: Payer: Medicare Other

## 2015-07-18 ENCOUNTER — Ambulatory Visit
Admission: RE | Admit: 2015-07-18 | Discharge: 2015-07-18 | Disposition: A | Discharge status: Home / Self Care | Payer: Medicare Other | Source: Ambulatory Visit

## 2015-07-18 DIAGNOSIS — Z1231 Encounter for screening mammogram for malignant neoplasm of breast: Secondary | ICD-10-CM

## 2015-08-19 DIAGNOSIS — H5203 Hypermetropia, bilateral: Secondary | ICD-10-CM | POA: Diagnosis not present

## 2015-08-19 DIAGNOSIS — H524 Presbyopia: Secondary | ICD-10-CM | POA: Diagnosis not present

## 2015-08-19 DIAGNOSIS — H2513 Age-related nuclear cataract, bilateral: Secondary | ICD-10-CM | POA: Diagnosis not present

## 2015-08-19 DIAGNOSIS — H52223 Regular astigmatism, bilateral: Secondary | ICD-10-CM | POA: Diagnosis not present

## 2015-09-05 DIAGNOSIS — E782 Mixed hyperlipidemia: Secondary | ICD-10-CM | POA: Diagnosis not present

## 2015-09-05 DIAGNOSIS — I129 Hypertensive chronic kidney disease with stage 1 through stage 4 chronic kidney disease, or unspecified chronic kidney disease: Secondary | ICD-10-CM | POA: Diagnosis not present

## 2015-09-20 DIAGNOSIS — G43109 Migraine with aura, not intractable, without status migrainosus: Secondary | ICD-10-CM | POA: Diagnosis not present

## 2015-09-23 ENCOUNTER — Encounter: Payer: Self-pay | Admitting: Internal Medicine

## 2015-12-05 DIAGNOSIS — L821 Other seborrheic keratosis: Secondary | ICD-10-CM | POA: Diagnosis not present

## 2015-12-05 DIAGNOSIS — B078 Other viral warts: Secondary | ICD-10-CM | POA: Diagnosis not present

## 2016-03-12 DIAGNOSIS — G2581 Restless legs syndrome: Secondary | ICD-10-CM | POA: Diagnosis not present

## 2016-03-12 DIAGNOSIS — K219 Gastro-esophageal reflux disease without esophagitis: Secondary | ICD-10-CM | POA: Diagnosis not present

## 2016-03-12 DIAGNOSIS — N183 Chronic kidney disease, stage 3 (moderate): Secondary | ICD-10-CM | POA: Diagnosis not present

## 2016-03-12 DIAGNOSIS — I129 Hypertensive chronic kidney disease with stage 1 through stage 4 chronic kidney disease, or unspecified chronic kidney disease: Secondary | ICD-10-CM | POA: Diagnosis not present

## 2016-03-12 DIAGNOSIS — G43909 Migraine, unspecified, not intractable, without status migrainosus: Secondary | ICD-10-CM | POA: Diagnosis not present

## 2016-03-12 DIAGNOSIS — E782 Mixed hyperlipidemia: Secondary | ICD-10-CM | POA: Diagnosis not present

## 2016-03-12 DIAGNOSIS — Z0000 Encounter for general adult medical examination without abnormal findings: Secondary | ICD-10-CM | POA: Diagnosis not present

## 2016-03-12 DIAGNOSIS — M810 Age-related osteoporosis without current pathological fracture: Secondary | ICD-10-CM | POA: Diagnosis not present

## 2016-03-12 DIAGNOSIS — F39 Unspecified mood [affective] disorder: Secondary | ICD-10-CM | POA: Diagnosis not present

## 2016-03-12 DIAGNOSIS — G479 Sleep disorder, unspecified: Secondary | ICD-10-CM | POA: Diagnosis not present

## 2016-03-17 DIAGNOSIS — M810 Age-related osteoporosis without current pathological fracture: Secondary | ICD-10-CM | POA: Diagnosis not present

## 2016-03-17 DIAGNOSIS — M8588 Other specified disorders of bone density and structure, other site: Secondary | ICD-10-CM | POA: Diagnosis not present

## 2016-04-19 DIAGNOSIS — N390 Urinary tract infection, site not specified: Secondary | ICD-10-CM | POA: Diagnosis not present

## 2016-06-29 ENCOUNTER — Other Ambulatory Visit: Payer: Self-pay | Admitting: Family Medicine

## 2016-06-29 DIAGNOSIS — Z1231 Encounter for screening mammogram for malignant neoplasm of breast: Secondary | ICD-10-CM

## 2016-07-30 ENCOUNTER — Ambulatory Visit
Admission: RE | Admit: 2016-07-30 | Discharge: 2016-07-30 | Disposition: A | Discharge status: Home / Self Care | Payer: Medicare Other | Source: Ambulatory Visit | Attending: Family Medicine | Admitting: Family Medicine

## 2016-07-30 DIAGNOSIS — Z1231 Encounter for screening mammogram for malignant neoplasm of breast: Secondary | ICD-10-CM

## 2016-09-10 DIAGNOSIS — I129 Hypertensive chronic kidney disease with stage 1 through stage 4 chronic kidney disease, or unspecified chronic kidney disease: Secondary | ICD-10-CM | POA: Diagnosis not present

## 2016-09-10 DIAGNOSIS — K589 Irritable bowel syndrome without diarrhea: Secondary | ICD-10-CM | POA: Diagnosis not present

## 2016-09-10 DIAGNOSIS — N183 Chronic kidney disease, stage 3 (moderate): Secondary | ICD-10-CM | POA: Diagnosis not present

## 2016-09-10 DIAGNOSIS — E782 Mixed hyperlipidemia: Secondary | ICD-10-CM | POA: Diagnosis not present

## 2016-09-17 DIAGNOSIS — H5203 Hypermetropia, bilateral: Secondary | ICD-10-CM | POA: Diagnosis not present

## 2016-09-17 DIAGNOSIS — H2513 Age-related nuclear cataract, bilateral: Secondary | ICD-10-CM | POA: Diagnosis not present

## 2016-09-17 DIAGNOSIS — H524 Presbyopia: Secondary | ICD-10-CM | POA: Diagnosis not present

## 2016-09-17 DIAGNOSIS — H04123 Dry eye syndrome of bilateral lacrimal glands: Secondary | ICD-10-CM | POA: Diagnosis not present

## 2016-09-29 DIAGNOSIS — G4700 Insomnia, unspecified: Secondary | ICD-10-CM | POA: Diagnosis not present

## 2016-09-29 DIAGNOSIS — G43109 Migraine with aura, not intractable, without status migrainosus: Secondary | ICD-10-CM | POA: Diagnosis not present

## 2016-09-29 DIAGNOSIS — F3131 Bipolar disorder, current episode depressed, mild: Secondary | ICD-10-CM | POA: Diagnosis not present

## 2016-10-01 DIAGNOSIS — N3000 Acute cystitis without hematuria: Secondary | ICD-10-CM | POA: Diagnosis not present

## 2016-10-01 DIAGNOSIS — R300 Dysuria: Secondary | ICD-10-CM | POA: Diagnosis not present

## 2016-10-09 DIAGNOSIS — H903 Sensorineural hearing loss, bilateral: Secondary | ICD-10-CM | POA: Diagnosis not present

## 2016-10-16 ENCOUNTER — Encounter: Payer: Self-pay | Admitting: Internal Medicine

## 2016-10-16 ENCOUNTER — Ambulatory Visit (INDEPENDENT_AMBULATORY_CARE_PROVIDER_SITE_OTHER): Payer: Medicare Other | Admitting: Internal Medicine

## 2016-10-16 ENCOUNTER — Encounter (INDEPENDENT_AMBULATORY_CARE_PROVIDER_SITE_OTHER): Payer: Self-pay

## 2016-10-16 VITALS — BP 122/72 | HR 90 | Ht 65.0 in | Wt 155.8 lb

## 2016-10-16 DIAGNOSIS — Z86010 Personal history of colonic polyps: Secondary | ICD-10-CM

## 2016-10-16 DIAGNOSIS — R159 Full incontinence of feces: Secondary | ICD-10-CM | POA: Diagnosis not present

## 2016-10-16 DIAGNOSIS — R109 Unspecified abdominal pain: Secondary | ICD-10-CM

## 2016-10-16 MED ORDER — NA SULFATE-K SULFATE-MG SULF 17.5-3.13-1.6 GM/177ML PO SOLN
1.0000 | Freq: Once | ORAL | 0 refills | Status: AC
Start: 2016-10-16 — End: 2016-10-16

## 2016-10-16 NOTE — Progress Notes (Signed)
HISTORY OF PRESENT ILLNESS:  Rachael Webster is a 71 y.o. female with past medical history as listed below who is sent today by her primary care provider Dr. Laurann Montana with chief complaints of epigastric pain and fecal incontinence. The patient has not been seen in this office since 2013 when she underwent colonoscopy for history of adenomatous colon polyps. She was found to have 3 polyps which were both adenomatous and sessile serrated in nature. Moderate diverticulosis in the sigmoid colon noted. As well, internal hemorrhoids. She is currently due for follow-up. Terms of epigastric pain, she describes having one episode about every 4-5 weeks. His been this way for some time. Typically occurs after breakfast and lasts most of the day. Described as a fullness discomfort. However, more recently symptoms persisted proximal we 3 days in a row. No associated back pain or weight loss. She does have a history of GERD which she is on omeprazole 20 mg daily. This seems to help classic symptoms. No dysphagia. Next, she reports at least an eight-month history of fecal incontinence. This occurs without awareness. More prominent when stools are softer or loose. She has been wearing protective undergarments. No new medications. Her weight has been stable. No bleeding. She is status post cholecystectomy. No recent blood work or imaging available for review the patient did undergo EGD remotely in 2010. Evaluation at that time for chest pain. The exam was normal.  REVIEW OF SYSTEMS:  All non-GI ROS negative unless otherwise stated in history of present illness except for headaches, hearing problems, sleeping problems  Past Medical History:  Diagnosis Date  . Depression   . FH: stomach ulcer    2001  . GERD (gastroesophageal reflux disease)   . Headache, chronic daily   . Hyperlipidemia   . Hypertension   . Osteopenia     Past Surgical History:  Procedure Laterality Date  . ABDOMINAL HYSTERECTOMY  1989  .  CHOLECYSTECTOMY  2007  . [place]   right  . [place]  reports that she has never smoked. She has never used smokeless tobacco. She reports that she does not drink alcohol or use drugs.  family history is not on file.  Allergies  Allergen Reactions  . Amitriptyline Hcl     Opposite effect: hyperactive  . Asa [Aspirin]     History of stomach ulcer       PHYSICAL EXAMINATION: Vital signs: BP 122/72   Pulse 90   Ht 5\' 5"  (8.850 m)   Wt 155 lb 12.8 oz (70.7 kg)   SpO2 99%   BMI 25.93 kg/m   Constitutional: generally well-appearing, no acute distress Psychiatric: alert and oriented x3, cooperative Eyes: extraocular movements intact, anicteric, conjunctiva pink Mouth: oral pharynx moist, no lesions Neck: supple no lymphadenopathy Cardiovascular: heart regular rate and rhythm, no murmur Lungs: clear to auscultation bilaterally Abdomen: soft, nontender, nondistended, no obvious ascites, no peritoneal signs, normal bowel sounds, no organomegaly Rectal:Deferred until colonoscopy Extremities: no clubbing cyanosis or lower extremity edema bilaterally Skin: no lesions on visible extremities Neuro: No focal deficits. Cranial nerves intact  ASSESSMENT:  #1. Recurrent epigastric pain as described. Possible breakthrough GERD. Rule out ulcer. Rule out functional dyspepsia #2. Status post cholecystectomy #3. Fecal incontinence without awareness #4. History of adenomatous colon polyps and SSP. Due for follow-up   PLAN:  #1. Strict adherence to reflux precautions #2. Continue PPI #3. Schedule upper endoscopy to evaluate recurrent  epigastric pain.The nature of the procedure, as well as the risks, benefits, and alternatives were carefully and thoroughly reviewed with the patient. Ample time for discussion and questions allowed. The patient understood, was satisfied, and agreed to proceed. #4. Recommend daily fiber  supplementation with Metamucil to see if this helps bowel habits and incontinence #5. Recommend that she continue to wear protective undergarments #6. Schedule Colonoscopy for surveillance and to further evaluate problems with incontinence.The nature of the procedure, as well as the risks, benefits, and alternatives were carefully and thoroughly reviewed with the patient. Ample time for discussion and questions allowed. The patient understood, was satisfied, and agreed to proceed. #7. Could consider advanced imaging of problems with pain worsening or alarm features develop. Would hold off for now  A copy of this consultation note has been sent to Dr. Kelton Pillar

## 2016-10-16 NOTE — Patient Instructions (Signed)
You have been scheduled for an endoscopy and colonoscopy. Please follow the written instructions given to you at your visit today. Please pick up your prep supplies at the pharmacy within the next 1-3 days. If you use inhalers (even only as needed), please bring them with you on the day of your procedure. Your physician has requested that you go to www.startemmi.com and enter the access code given to you at your visit today. This web site gives a general overview about your procedure. However, you should still follow specific instructions given to you by our office regarding your preparation for the procedure.   Continue to wear protective undergarments  Start taking 2 tablespoons of Metamucil daily

## 2016-10-19 ENCOUNTER — Telehealth: Payer: Self-pay | Admitting: Internal Medicine

## 2016-10-19 MED ORDER — NA SULFATE-K SULFATE-MG SULF 17.5-3.13-1.6 GM/177ML PO SOLN
1.0000 | Freq: Once | ORAL | 0 refills | Status: AC
Start: 2016-10-19 — End: 2016-10-19

## 2016-10-19 NOTE — Telephone Encounter (Signed)
Suprep sent to [facility] on [address]

## 2016-11-24 DIAGNOSIS — S52561A Barton's fracture of right radius, initial encounter for closed fracture: Secondary | ICD-10-CM | POA: Diagnosis not present

## 2016-11-26 DIAGNOSIS — S52561A Barton's fracture of right radius, initial encounter for closed fracture: Secondary | ICD-10-CM | POA: Diagnosis not present

## 2016-11-26 DIAGNOSIS — G8918 Other acute postprocedural pain: Secondary | ICD-10-CM | POA: Diagnosis not present

## 2016-11-26 DIAGNOSIS — S52571A Other intraarticular fracture of lower end of right radius, initial encounter for closed fracture: Secondary | ICD-10-CM | POA: Diagnosis not present

## 2016-11-26 HISTORY — PX: WRIST SURGERY: SHX841

## 2016-12-10 DIAGNOSIS — S52561D Barton's fracture of right radius, subsequent encounter for closed fracture with routine healing: Secondary | ICD-10-CM | POA: Diagnosis not present

## 2016-12-22 ENCOUNTER — Encounter: Payer: Medicare Other | Admitting: Internal Medicine

## 2016-12-25 DIAGNOSIS — S62101A Fracture of unspecified carpal bone, right wrist, initial encounter for closed fracture: Secondary | ICD-10-CM | POA: Diagnosis not present

## 2016-12-25 DIAGNOSIS — S52561D Barton's fracture of right radius, subsequent encounter for closed fracture with routine healing: Secondary | ICD-10-CM | POA: Diagnosis not present

## 2016-12-29 DIAGNOSIS — S62101D Fracture of unspecified carpal bone, right wrist, subsequent encounter for fracture with routine healing: Secondary | ICD-10-CM | POA: Diagnosis not present

## 2017-01-01 DIAGNOSIS — S62101A Fracture of unspecified carpal bone, right wrist, initial encounter for closed fracture: Secondary | ICD-10-CM | POA: Diagnosis not present

## 2017-01-05 DIAGNOSIS — S62101D Fracture of unspecified carpal bone, right wrist, subsequent encounter for fracture with routine healing: Secondary | ICD-10-CM | POA: Diagnosis not present

## 2017-01-08 DIAGNOSIS — S62101D Fracture of unspecified carpal bone, right wrist, subsequent encounter for fracture with routine healing: Secondary | ICD-10-CM | POA: Diagnosis not present

## 2017-01-12 DIAGNOSIS — S62101D Fracture of unspecified carpal bone, right wrist, subsequent encounter for fracture with routine healing: Secondary | ICD-10-CM | POA: Diagnosis not present

## 2017-01-14 DIAGNOSIS — S62101D Fracture of unspecified carpal bone, right wrist, subsequent encounter for fracture with routine healing: Secondary | ICD-10-CM | POA: Diagnosis not present

## 2017-01-19 DIAGNOSIS — S62101D Fracture of unspecified carpal bone, right wrist, subsequent encounter for fracture with routine healing: Secondary | ICD-10-CM | POA: Diagnosis not present

## 2017-01-22 DIAGNOSIS — S52561D Barton's fracture of right radius, subsequent encounter for closed fracture with routine healing: Secondary | ICD-10-CM | POA: Diagnosis not present

## 2017-01-25 DIAGNOSIS — H9313 Tinnitus, bilateral: Secondary | ICD-10-CM | POA: Diagnosis not present

## 2017-02-02 ENCOUNTER — Ambulatory Visit (AMBULATORY_SURGERY_CENTER): Payer: Self-pay

## 2017-02-02 VITALS — Ht 65.5 in | Wt 154.0 lb

## 2017-02-02 DIAGNOSIS — Z86010 Personal history of colonic polyps: Secondary | ICD-10-CM

## 2017-02-02 DIAGNOSIS — K219 Gastro-esophageal reflux disease without esophagitis: Secondary | ICD-10-CM

## 2017-02-02 MED ORDER — NA SULFATE-K SULFATE-MG SULF 17.5-3.13-1.6 GM/177ML PO SOLN: ORAL | 0 refills | Status: DC

## 2017-02-02 NOTE — Progress Notes (Signed)
Per pt, no allergies to soy or egg products.Pt not taking any weight loss meds or using  O2 at home.   Pt refused Emmi video. 

## 2017-02-03 ENCOUNTER — Encounter: Payer: Self-pay | Admitting: Internal Medicine

## 2017-02-16 ENCOUNTER — Encounter: Payer: Self-pay | Admitting: Internal Medicine

## 2017-02-16 ENCOUNTER — Ambulatory Visit (AMBULATORY_SURGERY_CENTER): Payer: Medicare Other | Admitting: Internal Medicine

## 2017-02-16 VITALS — BP 111/74 | HR 73 | Temp 99.3°F | Resp 14 | Ht 65.5 in | Wt 154.0 lb

## 2017-02-16 DIAGNOSIS — K6289 Other specified diseases of anus and rectum: Secondary | ICD-10-CM | POA: Diagnosis not present

## 2017-02-16 DIAGNOSIS — Z86010 Personal history of colonic polyps: Secondary | ICD-10-CM | POA: Diagnosis present

## 2017-02-16 DIAGNOSIS — D122 Benign neoplasm of ascending colon: Secondary | ICD-10-CM

## 2017-02-16 DIAGNOSIS — I10 Essential (primary) hypertension: Secondary | ICD-10-CM | POA: Diagnosis not present

## 2017-02-16 DIAGNOSIS — K623 Rectal prolapse: Secondary | ICD-10-CM | POA: Diagnosis not present

## 2017-02-16 DIAGNOSIS — K219 Gastro-esophageal reflux disease without esophagitis: Secondary | ICD-10-CM | POA: Diagnosis not present

## 2017-02-16 DIAGNOSIS — R1013 Epigastric pain: Secondary | ICD-10-CM

## 2017-02-16 MED ORDER — SODIUM CHLORIDE 0.9 % IV SOLN: 500.0000 mL | INTRAVENOUS | Status: DC

## 2017-02-16 NOTE — Progress Notes (Signed)
Report to PACU, RN, vss, BBS= Clear.  

## 2017-02-16 NOTE — Progress Notes (Signed)
Pt's states no medical or surgical changes since previsit or office visit. 

## 2017-02-16 NOTE — Op Note (Signed)
[facility] Patient Name: Rachael Webster Procedure Date: 02/16/2017 1:40 PM MRN: [id number] Endoscopist: Docia Chuck. Rachael Webster , MD Age: 71 Referring MD:  Date of Birth: [birth year] Gender: Female Account #: [id number] Procedure:                Upper GI endoscopy Indications:              Epigastric abdominal pain. Seen in the office in                            April. No problems recently Medicines:                Monitored Anesthesia Care Procedure:                Pre-Anesthesia Assessment:                           - Prior to the procedure, a History and Physical                            was performed, and patient medications and                            allergies were reviewed. The patient's tolerance of                            previous anesthesia was also reviewed. The risks                            and benefits of the procedure and the sedation                            options and risks were discussed with the patient.                            All questions were answered, and informed consent                            was obtained. Prior Anticoagulants: The patient has                            taken no previous anticoagulant or antiplatelet                            agents. ASA Grade Assessment: II - A patient with                            mild systemic disease. After reviewing the risks                            and benefits, the patient was deemed in                            satisfactory condition to undergo the procedure.  After obtaining informed consent, the endoscope was                            passed under direct vision. Throughout the                            procedure, the patient's blood pressure, pulse, and                            oxygen saturations were monitored continuously. The                            Endoscope was introduced through the mouth, and                            advanced to the second part of  duodenum. The upper                            GI endoscopy was accomplished without difficulty.                            The patient tolerated the procedure well. Scope In: Scope Out: Findings:                 The esophagus was normal.                           The stomach was normal.                           The examined duodenum was normal.                           The cardia and gastric fundus were normal on                            retroflexion. Complications:            No immediate complications. Estimated Blood Loss:     Estimated blood loss: none. Impression:               - Normal esophagus.                           - Normal stomach.                           - Normal examined duodenum.                           - No specimens collected. Recommendation:           - Patient has a contact number available for                            emergencies. The signs and symptoms of potential  delayed complications were discussed with the                            patient. Return to normal activities tomorrow.                            Written discharge instructions were provided to the                            patient.                           - Resume previous diet.                           - Continue present medications. Docia Chuck. Rachael Pastor, MD 02/16/2017 2:30:54 PM This report has been signed electronically.

## 2017-02-16 NOTE — Op Note (Signed)
[facility] Patient Name: Rachael Webster Procedure Date: 02/16/2017 1:40 PM MRN: [id number] Endoscopist: Docia Chuck. [name] , MD Age: 71 Referring MD:  Date of Birth: October 10, 1945 Gender: Female Account #: [id number] Procedure:                Colonoscopy, with cold snare polypectomy x 3 and                            biopsies Indications:              High risk colon cancer surveillance: Personal                            history of non-advanced adenoma, High risk colon                            cancer surveillance: Personal history of sessile                            serrated colon polyp (less than 10 mm in size) with                            no dysplasia. Previous examinations 2002, 2004,                            2008, and 2013 Medicines:                Monitored Anesthesia Care Procedure:                Pre-Anesthesia Assessment:                           - Prior to the procedure, a History and Physical                            was performed, and patient medications and                            allergies were reviewed. The patient's tolerance of                            previous anesthesia was also reviewed. The risks                            and benefits of the procedure and the sedation                            options and risks were discussed with the patient.                            All questions were answered, and informed consent                            was obtained. Prior Anticoagulants: The patient has  taken no previous anticoagulant or antiplatelet                            agents. ASA Grade Assessment: II - A patient with                            mild systemic disease. After reviewing the risks                            and benefits, the patient was deemed in                            satisfactory condition to undergo the procedure.                           After obtaining informed consent, the colonoscope                        was passed under direct vision. Throughout the                            procedure, the patient's blood pressure, pulse, and                            oxygen saturations were monitored continuously. The                            Colonoscope was introduced through the anus and                            advanced to the the cecum, identified by                            appendiceal orifice and ileocecal valve. The                            ileocecal valve, appendiceal orifice, and rectum                            were photographed. The quality of the bowel                            preparation was good. The colonoscopy was performed                            without difficulty. The patient tolerated the                            procedure well. The bowel preparation used was                            SUPREP. Scope In: 1:47:14 PM Scope Out: 2:09:37 PM Scope Withdrawal Time: 0 hours 17 minutes 23 seconds  Total Procedure Duration: 0 hours 22 minutes 23 seconds  Findings:  Three polyps were found in the ascending colon. The                            polyps were 1 to 4 mm in size. These polyps were                            removed with a cold snare. Resection and retrieval                            were complete.                           Multiple diverticula were found in the sigmoid                            colon.                           An area of mildly friable and erythematous mucosa                            was found in the distal rectum. Mild focal                            proctitis versus prolapse-type change. Biopsies                            were taken with a cold forceps for histology.                           Internal hemorrhoids were found during retroflexion.                           The exam was otherwise without abnormality on                            direct and retroflexion views. Complications:            No  immediate complications. Estimated blood loss:                            None. Estimated Blood Loss:     Estimated blood loss: none. Impression:               - Three 1 to 4 mm polyps in the ascending colon,                            removed with a cold snare. Resected and retrieved.                           - Diverticulosis in the sigmoid colon.                           - Friability in the distal rectum. Biopsied.                           -  Internal hemorrhoids.                           - The examination was otherwise normal on direct                            and retroflexion views. Recommendation:           - Repeat colonoscopy in 5 years for surveillance.                           - Patient has a contact number available for                            emergencies. The signs and symptoms of potential                            delayed complications were discussed with the                            patient. Return to normal activities tomorrow.                            Written discharge instructions were provided to the                            patient.                           - Resume previous diet.                           - Continue present medications.                           - Await pathology results. Docia Chuck. [name], MD 02/16/2017 2:27:55 PM This report has been signed electronically.

## 2017-02-16 NOTE — Progress Notes (Signed)
Called to room to assist during endoscopic procedure.  Patient ID and intended procedure confirmed with present staff. Received instructions for my participation in the procedure from the performing physician.  

## 2017-02-16 NOTE — Patient Instructions (Signed)
**  Handouts given on Polyps and Diverticulosis**  YOU HAD AN ENDOSCOPIC PROCEDURE TODAY: Refer to the procedure report and other information in the discharge instructions given to you for any specific questions about what was found during the examination. If this information does not answer your questions, please call [place] office at [phone] to clarify.   YOU SHOULD EXPECT: Some feelings of bloating in the abdomen. Passage of more gas than usual. Walking can help get rid of the air that was put into your GI tract during the procedure and reduce the bloating. If you had a lower endoscopy (such as a colonoscopy or flexible sigmoidoscopy) you may notice spotting of blood in your stool or on the toilet paper. Some abdominal soreness may be present for a day or two, also.  DIET: Your first meal following the procedure should be a light meal and then it is ok to progress to your normal diet. A half-sandwich or bowl of soup is an example of a good first meal. Heavy or fried foods are harder to digest and may make you feel nauseous or bloated. Drink plenty of fluids but you should avoid alcoholic beverages for 24 hours. If you had a esophageal dilation, please see attached instructions for diet.    ACTIVITY: Your care partner should take you home directly after the procedure. You should plan to take it easy, moving slowly for the rest of the day. You can resume normal activity the day after the procedure however YOU SHOULD NOT DRIVE, use power tools, machinery or perform tasks that involve climbing or major physical exertion for 24 hours (because of the sedation medicines used during the test).   SYMPTOMS TO REPORT IMMEDIATELY: A gastroenterologist can be reached at any hour. Please call [phone]  for any of the following symptoms:  Following lower endoscopy (colonoscopy, flexible sigmoidoscopy) Excessive amounts of blood in the stool  Significant tenderness, worsening of abdominal pains  Swelling  of the abdomen that is new, acute  Fever of 100 or higher  Following upper endoscopy (EGD, EUS, ERCP, esophageal dilation) Vomiting of blood or coffee ground material  New, significant abdominal pain  New, significant chest pain or pain under the shoulder blades  Painful or persistently difficult swallowing  New shortness of breath  Black, tarry-looking or red, bloody stools  FOLLOW UP:  If any biopsies were taken you will be contacted by phone or by letter within the next 1-3 weeks. Call [phone]  if you have not heard about the biopsies in 3 weeks.  Please also call with any specific questions about appointments or follow up tests.

## 2017-02-17 ENCOUNTER — Telehealth: Payer: Self-pay | Admitting: *Deleted

## 2017-02-17 NOTE — Telephone Encounter (Signed)
  Follow up Call-  Call back number 02/16/2017  Post procedure Call Back phone  # [phone]  Permission to leave phone message Yes  Some recent data might be hidden     Patient questions:  Do you have a fever, pain , or abdominal swelling? No. Pain Score  0 *  Have you tolerated food without any problems? Yes.    Have you been able to return to your normal activities? Yes.    Do you have any questions about your discharge instructions: Diet   No. Medications  No. Follow up visit  No.  Do you have questions or concerns about your Care? No.  Actions: * If pain score is 4 or above: No action needed, pain <4.

## 2017-02-18 ENCOUNTER — Encounter: Payer: Self-pay | Admitting: Internal Medicine

## 2017-02-19 DIAGNOSIS — S52561D Barton's fracture of right radius, subsequent encounter for closed fracture with routine healing: Secondary | ICD-10-CM | POA: Diagnosis not present

## 2017-02-21 DIAGNOSIS — S99922A Unspecified injury of left foot, initial encounter: Secondary | ICD-10-CM | POA: Diagnosis not present

## 2017-02-21 DIAGNOSIS — S92902A Unspecified fracture of left foot, initial encounter for closed fracture: Secondary | ICD-10-CM | POA: Diagnosis not present

## 2017-02-21 DIAGNOSIS — W108XXA Fall (on) (from) other stairs and steps, initial encounter: Secondary | ICD-10-CM | POA: Diagnosis not present

## 2017-02-23 DIAGNOSIS — S92592A Other fracture of left lesser toe(s), initial encounter for closed fracture: Secondary | ICD-10-CM | POA: Diagnosis not present

## 2017-03-08 DIAGNOSIS — S92592D Other fracture of left lesser toe(s), subsequent encounter for fracture with routine healing: Secondary | ICD-10-CM | POA: Diagnosis not present

## 2017-03-29 DIAGNOSIS — S92592D Other fracture of left lesser toe(s), subsequent encounter for fracture with routine healing: Secondary | ICD-10-CM | POA: Diagnosis not present

## 2017-03-30 DIAGNOSIS — N183 Chronic kidney disease, stage 3 (moderate): Secondary | ICD-10-CM | POA: Diagnosis not present

## 2017-03-30 DIAGNOSIS — Z0000 Encounter for general adult medical examination without abnormal findings: Secondary | ICD-10-CM | POA: Diagnosis not present

## 2017-03-30 DIAGNOSIS — I129 Hypertensive chronic kidney disease with stage 1 through stage 4 chronic kidney disease, or unspecified chronic kidney disease: Secondary | ICD-10-CM | POA: Diagnosis not present

## 2017-03-30 DIAGNOSIS — E782 Mixed hyperlipidemia: Secondary | ICD-10-CM | POA: Diagnosis not present

## 2017-04-03 DIAGNOSIS — R3129 Other microscopic hematuria: Secondary | ICD-10-CM | POA: Diagnosis not present

## 2017-04-03 DIAGNOSIS — N390 Urinary tract infection, site not specified: Secondary | ICD-10-CM | POA: Diagnosis not present

## 2017-04-19 DIAGNOSIS — N390 Urinary tract infection, site not specified: Secondary | ICD-10-CM | POA: Diagnosis not present

## 2017-06-28 ENCOUNTER — Other Ambulatory Visit: Payer: Self-pay | Admitting: Family Medicine

## 2017-06-28 DIAGNOSIS — Z1231 Encounter for screening mammogram for malignant neoplasm of breast: Secondary | ICD-10-CM

## 2017-07-13 DIAGNOSIS — H25013 Cortical age-related cataract, bilateral: Secondary | ICD-10-CM | POA: Diagnosis not present

## 2017-07-13 DIAGNOSIS — H25043 Posterior subcapsular polar age-related cataract, bilateral: Secondary | ICD-10-CM | POA: Diagnosis not present

## 2017-07-13 DIAGNOSIS — H0230 Blepharochalasis unspecified eye, unspecified eyelid: Secondary | ICD-10-CM | POA: Diagnosis not present

## 2017-07-13 DIAGNOSIS — H2513 Age-related nuclear cataract, bilateral: Secondary | ICD-10-CM | POA: Diagnosis not present

## 2017-07-23 DIAGNOSIS — H2511 Age-related nuclear cataract, right eye: Secondary | ICD-10-CM | POA: Diagnosis not present

## 2017-07-23 DIAGNOSIS — H52221 Regular astigmatism, right eye: Secondary | ICD-10-CM | POA: Diagnosis not present

## 2017-07-23 DIAGNOSIS — H2512 Age-related nuclear cataract, left eye: Secondary | ICD-10-CM | POA: Diagnosis not present

## 2017-07-23 DIAGNOSIS — H5201 Hypermetropia, right eye: Secondary | ICD-10-CM | POA: Diagnosis not present

## 2017-08-02 ENCOUNTER — Ambulatory Visit
Admission: RE | Admit: 2017-08-02 | Discharge: 2017-08-02 | Disposition: A | Discharge status: Home / Self Care | Payer: Medicare Other | Source: Ambulatory Visit | Attending: Family Medicine | Admitting: Family Medicine

## 2017-08-02 DIAGNOSIS — Z1231 Encounter for screening mammogram for malignant neoplasm of breast: Secondary | ICD-10-CM | POA: Diagnosis not present

## 2017-08-03 ENCOUNTER — Other Ambulatory Visit: Payer: Self-pay | Admitting: Family Medicine

## 2017-08-03 DIAGNOSIS — R928 Other abnormal and inconclusive findings on diagnostic imaging of breast: Secondary | ICD-10-CM

## 2017-08-06 DIAGNOSIS — H2512 Age-related nuclear cataract, left eye: Secondary | ICD-10-CM | POA: Diagnosis not present

## 2017-08-06 DIAGNOSIS — Z9842 Cataract extraction status, left eye: Secondary | ICD-10-CM | POA: Diagnosis not present

## 2017-08-06 DIAGNOSIS — H52223 Regular astigmatism, bilateral: Secondary | ICD-10-CM | POA: Diagnosis not present

## 2017-08-06 DIAGNOSIS — H5203 Hypermetropia, bilateral: Secondary | ICD-10-CM | POA: Diagnosis not present

## 2017-08-09 ENCOUNTER — Other Ambulatory Visit: Payer: Self-pay | Admitting: Family Medicine

## 2017-08-09 ENCOUNTER — Ambulatory Visit
Admission: RE | Admit: 2017-08-09 | Discharge: 2017-08-09 | Disposition: A | Discharge status: Home / Self Care | Payer: Medicare Other | Source: Ambulatory Visit | Attending: Family Medicine | Admitting: Family Medicine

## 2017-08-09 DIAGNOSIS — N6489 Other specified disorders of breast: Secondary | ICD-10-CM | POA: Diagnosis not present

## 2017-08-09 DIAGNOSIS — R928 Other abnormal and inconclusive findings on diagnostic imaging of breast: Secondary | ICD-10-CM | POA: Diagnosis not present

## 2017-08-09 DIAGNOSIS — N641 Fat necrosis of breast: Secondary | ICD-10-CM

## 2017-08-13 DIAGNOSIS — M545 Low back pain: Secondary | ICD-10-CM | POA: Diagnosis not present

## 2017-09-27 ENCOUNTER — Other Ambulatory Visit: Payer: Self-pay | Admitting: Family Medicine

## 2017-09-27 DIAGNOSIS — W19XXXA Unspecified fall, initial encounter: Secondary | ICD-10-CM | POA: Diagnosis not present

## 2017-09-27 DIAGNOSIS — N63 Unspecified lump in unspecified breast: Secondary | ICD-10-CM | POA: Diagnosis not present

## 2017-09-27 DIAGNOSIS — N6310 Unspecified lump in the right breast, unspecified quadrant: Secondary | ICD-10-CM

## 2017-09-30 DIAGNOSIS — G4700 Insomnia, unspecified: Secondary | ICD-10-CM | POA: Diagnosis not present

## 2017-09-30 DIAGNOSIS — G43109 Migraine with aura, not intractable, without status migrainosus: Secondary | ICD-10-CM | POA: Diagnosis not present

## 2017-10-01 ENCOUNTER — Other Ambulatory Visit: Payer: Self-pay | Admitting: Family Medicine

## 2017-10-01 ENCOUNTER — Ambulatory Visit
Admission: RE | Admit: 2017-10-01 | Discharge: 2017-10-01 | Disposition: A | Discharge status: Home / Self Care | Payer: Medicare Other | Source: Ambulatory Visit | Attending: Family Medicine | Admitting: Family Medicine

## 2017-10-01 DIAGNOSIS — R928 Other abnormal and inconclusive findings on diagnostic imaging of breast: Secondary | ICD-10-CM | POA: Diagnosis not present

## 2017-10-01 DIAGNOSIS — N6310 Unspecified lump in the right breast, unspecified quadrant: Secondary | ICD-10-CM

## 2017-10-01 DIAGNOSIS — N641 Fat necrosis of breast: Secondary | ICD-10-CM

## 2017-10-01 DIAGNOSIS — N6489 Other specified disorders of breast: Secondary | ICD-10-CM | POA: Diagnosis not present

## 2017-10-05 DIAGNOSIS — M6281 Muscle weakness (generalized): Secondary | ICD-10-CM | POA: Diagnosis not present

## 2017-10-05 DIAGNOSIS — R296 Repeated falls: Secondary | ICD-10-CM | POA: Diagnosis not present

## 2017-10-18 DIAGNOSIS — I129 Hypertensive chronic kidney disease with stage 1 through stage 4 chronic kidney disease, or unspecified chronic kidney disease: Secondary | ICD-10-CM | POA: Diagnosis not present

## 2017-10-18 DIAGNOSIS — E782 Mixed hyperlipidemia: Secondary | ICD-10-CM | POA: Diagnosis not present

## 2017-10-18 DIAGNOSIS — N183 Chronic kidney disease, stage 3 (moderate): Secondary | ICD-10-CM | POA: Diagnosis not present

## 2017-10-18 DIAGNOSIS — G720 Drug-induced myopathy: Secondary | ICD-10-CM | POA: Diagnosis not present

## 2017-11-09 ENCOUNTER — Other Ambulatory Visit: Payer: Medicare Other

## 2017-12-09 DIAGNOSIS — L851 Acquired keratosis [keratoderma] palmaris et plantaris: Secondary | ICD-10-CM | POA: Diagnosis not present

## 2017-12-09 DIAGNOSIS — L570 Actinic keratosis: Secondary | ICD-10-CM | POA: Diagnosis not present

## 2017-12-09 DIAGNOSIS — D485 Neoplasm of uncertain behavior of skin: Secondary | ICD-10-CM | POA: Diagnosis not present

## 2017-12-09 DIAGNOSIS — L723 Sebaceous cyst: Secondary | ICD-10-CM | POA: Diagnosis not present

## 2018-01-03 ENCOUNTER — Ambulatory Visit
Admission: RE | Admit: 2018-01-03 | Discharge: 2018-01-03 | Disposition: A | Discharge status: Home / Self Care | Payer: Medicare Other | Source: Ambulatory Visit | Attending: Family Medicine | Admitting: Family Medicine

## 2018-01-03 ENCOUNTER — Other Ambulatory Visit: Payer: Self-pay | Admitting: Family Medicine

## 2018-01-03 DIAGNOSIS — N6310 Unspecified lump in the right breast, unspecified quadrant: Secondary | ICD-10-CM

## 2018-01-03 DIAGNOSIS — N641 Fat necrosis of breast: Secondary | ICD-10-CM

## 2018-01-03 DIAGNOSIS — N6311 Unspecified lump in the right breast, upper outer quadrant: Secondary | ICD-10-CM | POA: Diagnosis not present

## 2018-01-03 DIAGNOSIS — R928 Other abnormal and inconclusive findings on diagnostic imaging of breast: Secondary | ICD-10-CM | POA: Diagnosis not present

## 2018-01-06 ENCOUNTER — Other Ambulatory Visit: Payer: Self-pay | Admitting: Family Medicine

## 2018-01-06 ENCOUNTER — Ambulatory Visit
Admission: RE | Admit: 2018-01-06 | Discharge: 2018-01-06 | Disposition: A | Discharge status: Home / Self Care | Payer: Medicare Other | Source: Ambulatory Visit | Attending: Family Medicine | Admitting: Family Medicine

## 2018-01-06 DIAGNOSIS — N6311 Unspecified lump in the right breast, upper outer quadrant: Secondary | ICD-10-CM | POA: Diagnosis not present

## 2018-01-06 DIAGNOSIS — N6310 Unspecified lump in the right breast, unspecified quadrant: Secondary | ICD-10-CM

## 2018-01-06 DIAGNOSIS — N6312 Unspecified lump in the right breast, upper inner quadrant: Secondary | ICD-10-CM | POA: Diagnosis not present

## 2018-01-06 DIAGNOSIS — N641 Fat necrosis of breast: Secondary | ICD-10-CM | POA: Diagnosis not present

## 2018-01-06 HISTORY — PX: BREAST BIOPSY: SHX20

## 2018-04-08 DIAGNOSIS — Z23 Encounter for immunization: Secondary | ICD-10-CM | POA: Diagnosis not present

## 2018-04-12 ENCOUNTER — Other Ambulatory Visit: Payer: Self-pay | Admitting: Family Medicine

## 2018-04-12 DIAGNOSIS — N641 Fat necrosis of breast: Secondary | ICD-10-CM

## 2018-04-18 DIAGNOSIS — L219 Seborrheic dermatitis, unspecified: Secondary | ICD-10-CM | POA: Diagnosis not present

## 2018-04-18 DIAGNOSIS — Z23 Encounter for immunization: Secondary | ICD-10-CM | POA: Diagnosis not present

## 2018-04-18 DIAGNOSIS — L570 Actinic keratosis: Secondary | ICD-10-CM | POA: Diagnosis not present

## 2018-04-18 DIAGNOSIS — L723 Sebaceous cyst: Secondary | ICD-10-CM | POA: Diagnosis not present

## 2018-06-08 DIAGNOSIS — Z1159 Encounter for screening for other viral diseases: Secondary | ICD-10-CM | POA: Diagnosis not present

## 2018-06-08 DIAGNOSIS — Z0000 Encounter for general adult medical examination without abnormal findings: Secondary | ICD-10-CM | POA: Diagnosis not present

## 2018-06-08 DIAGNOSIS — N183 Chronic kidney disease, stage 3 (moderate): Secondary | ICD-10-CM | POA: Diagnosis not present

## 2018-06-08 DIAGNOSIS — I129 Hypertensive chronic kidney disease with stage 1 through stage 4 chronic kidney disease, or unspecified chronic kidney disease: Secondary | ICD-10-CM | POA: Diagnosis not present

## 2018-06-08 DIAGNOSIS — F39 Unspecified mood [affective] disorder: Secondary | ICD-10-CM | POA: Diagnosis not present

## 2018-08-03 ENCOUNTER — Other Ambulatory Visit: Payer: Self-pay | Admitting: Family Medicine

## 2018-08-03 ENCOUNTER — Ambulatory Visit: Payer: Medicare Other

## 2018-08-03 ENCOUNTER — Ambulatory Visit
Admission: RE | Admit: 2018-08-03 | Discharge: 2018-08-03 | Disposition: A | Discharge status: Home / Self Care | Payer: Medicare Other | Source: Ambulatory Visit | Attending: Family Medicine | Admitting: Family Medicine

## 2018-08-03 DIAGNOSIS — N641 Fat necrosis of breast: Secondary | ICD-10-CM

## 2018-08-03 DIAGNOSIS — R921 Mammographic calcification found on diagnostic imaging of breast: Secondary | ICD-10-CM | POA: Diagnosis not present

## 2018-10-04 DIAGNOSIS — G43109 Migraine with aura, not intractable, without status migrainosus: Secondary | ICD-10-CM | POA: Diagnosis not present

## 2018-10-04 DIAGNOSIS — G4700 Insomnia, unspecified: Secondary | ICD-10-CM | POA: Diagnosis not present

## 2018-12-05 DIAGNOSIS — E782 Mixed hyperlipidemia: Secondary | ICD-10-CM | POA: Diagnosis not present

## 2019-02-02 ENCOUNTER — Ambulatory Visit
Admission: RE | Admit: 2019-02-02 | Discharge: 2019-02-02 | Disposition: A | Discharge status: Home / Self Care | Payer: Medicare Other | Source: Ambulatory Visit | Attending: Family Medicine | Admitting: Family Medicine

## 2019-02-02 ENCOUNTER — Other Ambulatory Visit: Payer: Self-pay

## 2019-02-02 DIAGNOSIS — N641 Fat necrosis of breast: Secondary | ICD-10-CM

## 2019-02-02 DIAGNOSIS — R928 Other abnormal and inconclusive findings on diagnostic imaging of breast: Secondary | ICD-10-CM | POA: Diagnosis not present

## 2019-02-08 DIAGNOSIS — Z0120 Encounter for dental examination and cleaning without abnormal findings: Secondary | ICD-10-CM | POA: Diagnosis not present

## 2019-03-04 DIAGNOSIS — Z23 Encounter for immunization: Secondary | ICD-10-CM | POA: Diagnosis not present

## 2019-04-25 DIAGNOSIS — M5136 Other intervertebral disc degeneration, lumbar region: Secondary | ICD-10-CM | POA: Diagnosis not present

## 2019-04-25 DIAGNOSIS — M545 Low back pain: Secondary | ICD-10-CM | POA: Diagnosis not present

## 2019-04-25 DIAGNOSIS — M5417 Radiculopathy, lumbosacral region: Secondary | ICD-10-CM | POA: Diagnosis not present

## 2019-05-25 DIAGNOSIS — E782 Mixed hyperlipidemia: Secondary | ICD-10-CM | POA: Diagnosis not present

## 2019-07-05 ENCOUNTER — Ambulatory Visit: Payer: Medicare Other | Attending: Internal Medicine

## 2019-07-05 DIAGNOSIS — Z23 Encounter for immunization: Secondary | ICD-10-CM

## 2019-07-06 NOTE — Progress Notes (Signed)
Entering a late entry note. Observation and immunization given yesterday. Observation time was 12:48pm on 07/05/2019   Covid-19 Vaccination Clinic  Name:  Rachael Webster    MRN: [id number] DOB: 03-07-1946  07/06/2019  [name] was observed post Covid-19 immunization for 15 minutes without incidence. She was provided with Vaccine Information Sheet and instruction to access the V-Safe system.   [name] was instructed to call 911 with any severe reactions post vaccine: Marland Kitchen Difficulty breathing  . Swelling of your face and throat  . A fast heartbeat  . A bad rash all over your body  . Dizziness and weakness    Immunizations Administered    Name Date Dose VIS Date Route   Pfizer COVID-19 Vaccine 07/05/2019  5:33 PM 0.3 mL 06/02/2019 Intramuscular   Manufacturer: [place]   Lot: [id number]   [place]: [id number]

## 2019-07-21 DIAGNOSIS — N183 Chronic kidney disease, stage 3 unspecified: Secondary | ICD-10-CM | POA: Diagnosis not present

## 2019-07-21 DIAGNOSIS — Z0000 Encounter for general adult medical examination without abnormal findings: Secondary | ICD-10-CM | POA: Diagnosis not present

## 2019-07-21 DIAGNOSIS — E782 Mixed hyperlipidemia: Secondary | ICD-10-CM | POA: Diagnosis not present

## 2019-07-21 DIAGNOSIS — I129 Hypertensive chronic kidney disease with stage 1 through stage 4 chronic kidney disease, or unspecified chronic kidney disease: Secondary | ICD-10-CM | POA: Diagnosis not present

## 2019-07-26 ENCOUNTER — Ambulatory Visit: Payer: Medicare Other | Attending: Internal Medicine

## 2019-07-26 DIAGNOSIS — Z23 Encounter for immunization: Secondary | ICD-10-CM | POA: Insufficient documentation

## 2019-07-26 NOTE — Progress Notes (Signed)
   Covid-19 Vaccination Clinic  Name:  Goldye Lehenbauer    MRN: [id number] DOB: [birth year]  07/26/2019  Ms. Taneja was observed post Covid-19 immunization for 15 minutes without incidence. She was provided with Vaccine Information Sheet and instruction to access the V-Safe system.   Ms. Inscoe was instructed to call 911 with any severe reactions post vaccine: Marland Kitchen Difficulty breathing  . Swelling of your face and throat  . A fast heartbeat  . A bad rash all over your body  . Dizziness and weakness    Immunizations Administered    Name Date Dose VIS Date Route   Pfizer COVID-19 Vaccine 07/26/2019 11:57 AM 0.3 mL 06/02/2019 Intramuscular   Manufacturer: [place]   Lot: [id number]   [place]: [id number]

## 2019-07-28 ENCOUNTER — Other Ambulatory Visit: Payer: Self-pay | Admitting: Family Medicine

## 2019-07-28 DIAGNOSIS — M810 Age-related osteoporosis without current pathological fracture: Secondary | ICD-10-CM

## 2019-07-28 DIAGNOSIS — Z1231 Encounter for screening mammogram for malignant neoplasm of breast: Secondary | ICD-10-CM

## 2019-08-22 DIAGNOSIS — Z0120 Encounter for dental examination and cleaning without abnormal findings: Secondary | ICD-10-CM | POA: Diagnosis not present

## 2019-09-21 DIAGNOSIS — H524 Presbyopia: Secondary | ICD-10-CM | POA: Diagnosis not present

## 2019-10-04 DIAGNOSIS — G43109 Migraine with aura, not intractable, without status migrainosus: Secondary | ICD-10-CM | POA: Diagnosis not present

## 2019-10-04 DIAGNOSIS — G4700 Insomnia, unspecified: Secondary | ICD-10-CM | POA: Diagnosis not present

## 2019-10-17 ENCOUNTER — Ambulatory Visit
Admission: RE | Admit: 2019-10-17 | Discharge: 2019-10-17 | Disposition: A | Discharge status: Home / Self Care | Payer: Medicare Other | Source: Ambulatory Visit | Attending: Family Medicine | Admitting: Family Medicine

## 2019-10-17 ENCOUNTER — Other Ambulatory Visit: Payer: Self-pay

## 2019-10-17 DIAGNOSIS — Z780 Asymptomatic menopausal state: Secondary | ICD-10-CM | POA: Diagnosis not present

## 2019-10-17 DIAGNOSIS — M810 Age-related osteoporosis without current pathological fracture: Secondary | ICD-10-CM | POA: Diagnosis not present

## 2019-10-17 DIAGNOSIS — Z1231 Encounter for screening mammogram for malignant neoplasm of breast: Secondary | ICD-10-CM | POA: Diagnosis not present

## 2019-10-17 DIAGNOSIS — M8588 Other specified disorders of bone density and structure, other site: Secondary | ICD-10-CM | POA: Diagnosis not present

## 2019-10-19 DIAGNOSIS — Z961 Presence of intraocular lens: Secondary | ICD-10-CM | POA: Diagnosis not present

## 2019-10-19 DIAGNOSIS — H26491 Other secondary cataract, right eye: Secondary | ICD-10-CM | POA: Diagnosis not present

## 2019-10-19 DIAGNOSIS — H26492 Other secondary cataract, left eye: Secondary | ICD-10-CM | POA: Diagnosis not present

## 2019-10-19 DIAGNOSIS — H18413 Arcus senilis, bilateral: Secondary | ICD-10-CM | POA: Diagnosis not present

## 2019-10-26 DIAGNOSIS — H5203 Hypermetropia, bilateral: Secondary | ICD-10-CM | POA: Diagnosis not present

## 2019-10-26 DIAGNOSIS — Z961 Presence of intraocular lens: Secondary | ICD-10-CM | POA: Diagnosis not present

## 2019-10-26 DIAGNOSIS — Z9841 Cataract extraction status, right eye: Secondary | ICD-10-CM | POA: Diagnosis not present

## 2019-10-26 DIAGNOSIS — H52223 Regular astigmatism, bilateral: Secondary | ICD-10-CM | POA: Diagnosis not present

## 2019-11-14 DIAGNOSIS — H26492 Other secondary cataract, left eye: Secondary | ICD-10-CM | POA: Diagnosis not present

## 2019-11-22 DIAGNOSIS — Z961 Presence of intraocular lens: Secondary | ICD-10-CM | POA: Diagnosis not present

## 2019-12-26 ENCOUNTER — Telehealth: Payer: Self-pay | Admitting: Internal Medicine

## 2019-12-26 NOTE — Telephone Encounter (Signed)
Spoke with patient, patient reports a history of internal hemorrhoids but now feels that she has external hemorrhoids, pt denies any bleeding except tiny spots on the toilet paper at times, pt states that she does have pain with bowel movements all of the time but she does get pain if she is out gardening or cleaning up around the house. Pt states that she has tried Preparation H suppositories that she has only been using once a week, but not consistently. Pt advised to try Recticare, sitz baths, pat area dry.  Pt scheduled on 02/21/20 for follow up appointment. Any further recommendations until appt?

## 2019-12-27 ENCOUNTER — Ambulatory Visit: Payer: Medicare Other | Admitting: Neurology

## 2019-12-27 ENCOUNTER — Encounter: Payer: Self-pay | Admitting: Neurology

## 2019-12-27 ENCOUNTER — Other Ambulatory Visit: Payer: Self-pay

## 2019-12-27 VITALS — BP 125/82 | HR 81 | Ht 65.5 in | Wt 151.0 lb

## 2019-12-27 DIAGNOSIS — G4700 Insomnia, unspecified: Secondary | ICD-10-CM | POA: Diagnosis not present

## 2019-12-27 DIAGNOSIS — L820 Inflamed seborrheic keratosis: Secondary | ICD-10-CM | POA: Diagnosis not present

## 2019-12-27 DIAGNOSIS — M5481 Occipital neuralgia: Secondary | ICD-10-CM

## 2019-12-27 DIAGNOSIS — G43009 Migraine without aura, not intractable, without status migrainosus: Secondary | ICD-10-CM

## 2019-12-27 MED ORDER — VENLAFAXINE HCL ER 75 MG PO CP24: 225.0000 mg | ORAL_CAPSULE | Freq: Every day | ORAL | 4 refills | Status: DC

## 2019-12-27 MED ORDER — METAXALONE 800 MG PO TABS: 800.0000 mg | ORAL_TABLET | ORAL | 11 refills | Status: DC | PRN

## 2019-12-27 MED ORDER — LAMOTRIGINE 100 MG PO TBDP: 100.0000 mg | ORAL_TABLET | Freq: Two times a day (BID) | ORAL | 4 refills | Status: DC

## 2019-12-27 NOTE — Patient Instructions (Addendum)
Insomnia counseling: [facility]. [address]? Also Eino Farber at [facility] as well Continue current medications Consider in the future to try and get injections into the neck or occipital nerve and see if this helps with the occipital headaches ("occipital nerve")  Insomnia Insomnia is a sleep disorder that makes it difficult to fall asleep or stay asleep. Insomnia can cause fatigue, low energy, difficulty concentrating, mood swings, and poor performance at work or school. There are three different ways to classify insomnia:  Difficulty falling asleep.  Difficulty staying asleep.  Waking up too early in the morning. Any type of insomnia can be long-term (chronic) or short-term (acute). Both are common. Short-term insomnia usually lasts for three months or less. Chronic insomnia occurs at least three times a week for longer than three months. What are the causes? Insomnia may be caused by another condition, situation, or substance, such as:  Anxiety.  Certain medicines.  Gastroesophageal reflux disease (GERD) or other gastrointestinal conditions.  Asthma or other breathing conditions.  Restless legs syndrome, sleep apnea, or other sleep disorders.  Chronic pain.  Menopause.  Stroke.  Abuse of alcohol, tobacco, or illegal drugs.  Mental health conditions, such as depression.  Caffeine.  Neurological disorders, such as Alzheimer's disease.  An overactive thyroid (hyperthyroidism). Sometimes, the cause of insomnia may not be known. What increases the risk? Risk factors for insomnia include:  Gender. Women are affected more often than men.  Age. Insomnia is more common as you get older.  Stress.  Lack of exercise.  Irregular work schedule or working night shifts.  Traveling between different time zones.  Certain medical and mental health conditions. What are the signs or symptoms? If you have insomnia, the main symptom is having trouble  falling asleep or having trouble staying asleep. This may lead to other symptoms, such as:  Feeling fatigued or having low energy.  Feeling nervous about going to sleep.  Not feeling rested in the morning.  Having trouble concentrating.  Feeling irritable, anxious, or depressed. How is this diagnosed? This condition may be diagnosed based on:  Your symptoms and medical history. Your health care provider may ask about: ? Your sleep habits. ? Any medical conditions you have. ? Your mental health.  A physical exam. How is this treated? Treatment for insomnia depends on the cause. Treatment may focus on treating an underlying condition that is causing insomnia. Treatment may also include:  Medicines to help you sleep.  Counseling or therapy.  Lifestyle adjustments to help you sleep better. Follow these instructions at home: Eating and drinking   Limit or avoid alcohol, caffeinated beverages, and cigarettes, especially close to bedtime. These can disrupt your sleep.  Do not eat a large meal or eat spicy foods right before bedtime. This can lead to digestive discomfort that can make it hard for you to sleep. Sleep habits   Keep a sleep diary to help you and your health care provider figure out what could be causing your insomnia. Write down: ? When you sleep. ? When you wake up during the night. ? How well you sleep. ? How rested you feel the next day. ? Any side effects of medicines you are taking. ? What you eat and drink.  Make your bedroom a dark, comfortable place where it is easy to fall asleep. ? Put up shades or blackout curtains to block light from outside. ? Use a white noise machine to block noise. ? Keep the temperature cool.  Limit  screen use before bedtime. This includes: ? Watching TV. ? Using your smartphone, tablet, or computer.  Stick to a routine that includes going to bed and waking up at the same times every day and night. This can help you fall  asleep faster. Consider making a quiet activity, such as reading, part of your nighttime routine.  Try to avoid taking naps during the day so that you sleep better at night.  Get out of bed if you are still awake after 15 minutes of trying to sleep. Keep the lights down, but try reading or doing a quiet activity. When you feel sleepy, go back to bed. General instructions  Take over-the-counter and prescription medicines only as told by your health care provider.  Exercise regularly, as told by your health care provider. Avoid exercise starting several hours before bedtime.  Use relaxation techniques to manage stress. Ask your health care provider to suggest some techniques that may work well for you. These may include: ? Breathing exercises. ? Routines to release muscle tension. ? Visualizing peaceful scenes.  Make sure that you drive carefully. Avoid driving if you feel very sleepy.  Keep all follow-up visits as told by your health care provider. This is important. Contact a health care provider if:  You are tired throughout the day.  You have trouble in your daily routine due to sleepiness.  You continue to have sleep problems, or your sleep problems get worse. Get help right away if:  You have serious thoughts about hurting yourself or someone else. If you ever feel like you may hurt yourself or others, or have thoughts about taking your own life, get help right away. You can go to your nearest emergency department or call:  Your local emergency services (911 in the [place]  A suicide crisis helpline, such as the [place] at (819) 664-7724. This is open 24 hours a day. Summary  Insomnia is a sleep disorder that makes it difficult to fall asleep or stay asleep.  Insomnia can be long-term (chronic) or short-term (acute).  Treatment for insomnia depends on the cause. Treatment may focus on treating an underlying condition that is causing  insomnia.  Keep a sleep diary to help you and your health care provider figure out what could be causing your insomnia. This information is not intended to replace advice given to you by your health care provider. Make sure you discuss any questions you have with your health care provider. Document Revised: 05/21/2017 Document Reviewed: 03/18/2017 Elsevier Patient Education  2020 [employer].

## 2019-12-27 NOTE — Progress Notes (Signed)
[facility]    Provider:  Dr Jaynee Eagles Requesting Provider: Amil Amen, MD Primary Care Provider:  Kelton Pillar, MD  CC:  migraines  HPI:  Rachael Webster is a 74 y.o. female here as requested by Amil Amen, MD for migraines. PMHx migraine with aura and without status migrainosus not intractable, persistent disorder of initiating or maintaining sleep.  I reviewed Dr. Delene Ruffini notes, as well as Dr. Joanna Puff notes who was her previous neurologist, patient doing well on Effexor and Lamictal.  She described her migraines as severe, moderate mild, per Dr. Joanna Puff notes, location of the headache was the back of the head and neck which would last for days, dull tight, sensitivity to noise and odor, no nausea vomiting sensitivity to light or worsening with movement activities or dizziness.  Headache triggers include fatigue, worrying, odors, medications, hormone changes and emotional stress.  At Dr. Joanna Puff last appointment overall she was doing well, small tension headaches from time to time, it appears she is on Ambien at bedtime  From a thorough review of records medications that patient has tried that can be used in migraine management include: Aspirin, capsaicin, Tylenol for, Darvocet, Excedrin, tramadol, DHE injection, Imitrex, Midrin, zolmitriptan, Inderal/propranolol, Norvasc/amlodipine and atenolol, Benadryl, Dramamine and Phenergan, Aleve, Naprosyn, indomethacin, Toradol, Flexeril, Depakote, Neurontin/gabapentin, topiramate, prednisone, melatonin, ramelteon and Silenor for sleeping, paroxetine, fluoxetine, mirtazapine and doxepin, Botox, Myobloc and trigger point injections.  For years, "many many many" years she lived with rebound headache and did not know it. She was taking daily medications and daily hydrocodone, when she found out it made a huge difference. Dr. Sima Matas stopped her daily oxy and it really helped. She significantly improved after that. She  has headaches in the back of the head bilaterally and working in the yard will irritate her neck and when that happens metaxalone. She has a dull pressure in the back of the head, no shooting or throbbing but will radiate to behind the eyes, she may have it once a week however worse over the last 6 weeks due to stress. But usually her baseline is once a week, it can last a whole day even treated with tylenol. Over the last few weeks she has been taking medications more. Not throbbing, no light sensitivity but can ave sound sensitivity when the headache is very bad, she has a lot of neck pain usually especially with a headache. Neck pain triggers especially is sleeping on it the wrong way or ding a lot of yard work. She says Lamotrigine also helped with headaches y possibly helping with her mood as she had back headaches once a month with some depression and the lamotrigine.   She has very poor sleep, she uses [place] and she doesn't want to, she goes to bed at 1130 and takes an [place], she will read for 45 minutes and then she will lay down and she is wide awake. She will lay there as long as she will tolerate it, she gets up at 130-2am and plays a computer game, then she goes to bed and sleeps late. I recommended sleep counseling, she had a sleep study.   Reviewed notes, labs and imaging from outside physicians, which showed:  See above  Review of Systems: Patient complains of symptoms per HPI as well as the following symptoms:insomnia. Pertinent negatives and positives per HPI. All others negative.   Social History   Socioeconomic History  . Marital status: Married    Spouse name: Not on file  .  Number of children: Not on file  . Years of education: Not on file  . Highest education level: Not on file  Occupational History  . Not on file  Tobacco Use  . Smoking status: Never Smoker  . Smokeless tobacco: Never Used  Substance and Sexual Activity  . Alcohol use: No  . Drug use: No  . Sexual  activity: Not on file  Other Topics Concern  . Not on file  Social History Narrative   Lives at home with spouse   Left handed   Caffeine: maybe 4 oz per day    Social Determinants of Health   Financial Resource Strain:   . Difficulty of Paying Living Expenses:   Food Insecurity:   . Worried About Charity fundraiser in the Last Year:   . Arboriculturist in the Last Year:   Transportation Needs:   . Film/video editor (Medical):   Marland Kitchen Lack of Transportation (Non-Medical):   Physical Activity:   . Days of Exercise per Week:   . Minutes of Exercise per Session:   Stress:   . Feeling of Stress :   Social Connections:   . Frequency of Communication with Friends and Family:   . Frequency of Social Gatherings with Friends and Family:   . Attends Religious Services:   . Active Member of Clubs or Organizations:   . Attends Archivist Meetings:   Marland Kitchen Marital Status:   Intimate Partner Violence:   . Fear of Current or Ex-Partner:   . Emotionally Abused:   Marland Kitchen Physically Abused:   . Sexually Abused:     Family History  Adopted: Yes  Problem Relation Age of Onset  . Colon cancer Neg Hx   . Stomach cancer Neg Hx     Past Medical History:  Diagnosis Date  . Abdominal pain    middle of abd, 1 time only  . Depression   . Fecal incontinence    metamucill helps  . FH: stomach ulcer    2001/ per pt, personal hx of ulcer, no family  . GERD (gastroesophageal reflux disease)   . Headache, chronic daily   . Hearing loss    Wears [place] hearing aids.  . Hyperlipidemia   . Hypertension   . Osteopenia   . Post-operative nausea and vomiting 1986   only 1 time    Patient Active Problem List   Diagnosis Date Noted  . Cervico-occipital neuralgia 12/27/2019  . GERD 08/07/2008  . DIVERTICULOSIS-COLON 08/07/2008  . NAUSEA ALONE 08/07/2008  . DYSPHAGIA 08/07/2008  . PERSONAL HX COLONIC POLYPS 08/07/2008  . HYPERLIPIDEMIA 11/07/2007  . HYPERTENSION 11/07/2007  . CHRONIC  HEADACHES 11/07/2007    Past Surgical History:  Procedure Laterality Date  . ABDOMINAL HYSTERECTOMY  1989  . BREAST BIOPSY Left 2016  . BREAST BIOPSY Right 01/06/2018   x2  . BREAST EXCISIONAL BIOPSY Right   . CHOLECYSTECTOMY  2007  . [place]   right  . TONSILLECTOMY  1956  . TUBAL LIGATION  1976  . WRIST SURGERY  11/26/2016   right wrist    Current Outpatient Medications  Medication Sig Dispense Refill  . amLODipine (NORVASC) 10 MG tablet Take 10 mg by mouth daily.     . Calcium Carbonate-Vitamin D (CALCIUM 600/VITAMIN D PO) Take by mouth in the morning and at bedtime.     . Cholecalciferol (VITAMIN D) 2000 UNITS CAPS Take by mouth daily.    . clonazePAM (  KLONOPIN) 0.5 MG tablet Take 0.5 mg by mouth 2 (two) times daily as needed.     . ezetimibe (ZETIA) 10 MG tablet Take 10 mg by mouth at bedtime.    . fish oil-omega-3 fatty acids 1000 MG capsule Take 2 g by mouth daily.     . fluvastatin XL (LESCOL XL) 80 MG 24 hr tablet Take 80 mg by mouth daily.    Marland Kitchen lamoTRIgine 100 MG TBDP Take 1 tablet (100 mg total) by mouth 2 (two) times daily. 180 tablet 4  . Multiple Vitamins-Minerals (CENTRUM SILVER ADULT 50+ PO) Take by mouth.    Marland Kitchen omeprazole (PRILOSEC) 20 MG capsule Take 20 mg by mouth daily.    Marland Kitchen venlafaxine XR (EFFEXOR-XR) 75 MG 24 hr capsule Take 3 capsules (225 mg total) by mouth daily. 90 capsule 4  . zolpidem (AMBIEN) 10 MG tablet Take 5 mg by mouth at bedtime as needed.     . metaxalone (SKELAXIN) 800 MG tablet Take 1 tablet (800 mg total) by mouth as needed. 30 tablet 11   Current Facility-Administered Medications  Medication Dose Route Frequency Provider Last Rate Last Admin  . 0.9 %  sodium chloride infusion  500 mL Intravenous Continuous Irene Shipper, MD        Allergies as of 12/27/2019 - Review Complete 12/27/2019  Allergen Reaction Noted  . Amitriptyline hcl    . Asa [aspirin]  03/07/2012    Vitals: BP 125/82 (BP Location: Left Arm, Patient  Position: Sitting)   Pulse 81   Ht 5' 5.5" (1.664 m)   Wt 151 lb (68.5 kg)   BMI 24.75 kg/m  Last Weight:  Wt Readings from Last 1 Encounters:  12/27/19 151 lb (68.5 kg)   Last Height:   Ht Readings from Last 1 Encounters:  12/27/19 5' 5.5" (1.664 m)     Physical exam: Exam: Gen: NAD, conversant, well nourised, well groomed                     CV: RRR, no MRG. No Carotid Bruits. No peripheral edema, warm, nontender Eyes: Conjunctivae clear without exudates or hemorrhage  Neuro: Detailed Neurologic Exam  Speech:    Speech is normal; fluent and spontaneous with normal comprehension.  Cognition:    The patient is oriented to person, place, and time;     recent and remote memory intact;     language fluent;     normal attention, concentration,     fund of knowledge Cranial Nerves:    The pupils are equal, round, and reactive to light. Attempted fundoscopy could not visualize Visual fields are full to finger confrontation. Extraocular movements are intact. Trigeminal sensation is intact and the muscles of mastication are normal. The face is symmetric. The palate elevates in the midline. Hearing intact. Voice is normal. Shoulder shrug is normal. The tongue has normal motion without fasciculations.   Coordination:    No   Gait:    Good stride, arm swing and balance  Motor Observation:    No asymmetry, no atrophy, and no involuntary movements noted. Tone:    Normal muscle tone.    Posture:    Posture is normal. normal erect    Strength:    Strength is V/V in the upper and lower limbs.      Sensation: intact to LT     Reflex Exam:  DTR's:    Deep tendon reflexes in the upper and lower extremities are symmetrical bilaterally.  Toes:    The toes are downgoing bilaterally.   Clonus:    Clonus is absent.    Assessment/Plan:   74 y.o. female here as requested by Amil Amen, MD for migraines. PMHx migraine with aura and without status migrainosus not  intractable, persistent disorder of initiating or maintaining sleep.   Patient appears to have a lot of cervico-occipital head pain, I do wonder if degenerative changes in her cervical spine are irritating the occipital nerves giving her these occipital headaches.  I did discuss nerve blocks with her and also other procedures were we can send her to Dr. Ernestina Patches for evaluation of medial branch blocks or radiofrequency ablation she is going to think about it.  Also discussed and gave her information on dry needling.  For her migraine she has been doing well on: Norvasc, lamotrigine, as needed metaxalone and Effexor.  Continue.  Insomnia and poor sleep habits: She has been on Ambien for many years, I did discuss risks of long-term Ambien use and recommend that she is weaned off of that, I do think that she needs insomnia counseling and we discussed this at length, someone who can help her with compensatory skills to try to help her get to sleep and also help get her off Ambien.  I will ask our referral staff to see if anyone does insomnia counseling in the area, I did refer her to [facility] and also possibly [place] might have insomnia counseling.  No orders of the defined types were placed in this encounter.  Meds ordered this encounter  Medications  . venlafaxine XR (EFFEXOR-XR) 75 MG 24 hr capsule    Sig: Take 3 capsules (225 mg total) by mouth daily.    Dispense:  90 capsule    Refill:  4  . lamoTRIgine 100 MG TBDP    Sig: Take 1 tablet (100 mg total) by mouth 2 (two) times daily.    Dispense:  180 tablet    Refill:  4  . metaxalone (SKELAXIN) 800 MG tablet    Sig: Take 1 tablet (800 mg total) by mouth as needed.    Dispense:  30 tablet    Refill:  11    Cc: Amil Amen, MD,    Sarina Ill, MD  [facility] [address] [place], [place] [address]  Phone [phone] Fax [phone]  I spent 60 minutes of  face-to-face and non-face-to-face time with patient on the  1. Cervico-occipital neuralgia   2. Insomnia, unspecified type    diagnosis.  This included previsit chart review, lab review, study review, order entry, electronic health record documentation, patient education on the different diagnostic and therapeutic options, counseling and coordination of care, risks and benefits of management, compliance, or risk factor reduction

## 2019-12-27 NOTE — Telephone Encounter (Signed)
Maybe add a daily fiber supplement such as Metamucil or Citrucel, 2 tablespoons.

## 2019-12-27 NOTE — Telephone Encounter (Signed)
Spoke with patient regarding recommendations. Pt advised to call office with any other concerns.

## 2019-12-28 ENCOUNTER — Telehealth: Payer: Self-pay | Admitting: Neurology

## 2019-12-28 NOTE — Telephone Encounter (Signed)
[facility] called patient to schedule and patient does not want to schedule at this time she will call back when she is ready .

## 2020-01-03 ENCOUNTER — Telehealth: Payer: Self-pay | Admitting: Neurology

## 2020-01-03 NOTE — Telephone Encounter (Signed)
PA for lamotrigine 100mg  has been approved from 01/03/20-01/02/21.

## 2020-01-03 NOTE — Telephone Encounter (Signed)
Received PA requests for both lamotrigine 100mg  and metaxalone 800mg . Both PAs were started on [email or web address].   PA Key for lamotrigine 100mg : [id number]. Determination will be made within the next 3 business days.   PA Key for metaxalone 800mg : [id number]. Determination will be made within the next 3 business days.

## 2020-01-04 NOTE — Telephone Encounter (Signed)
PA for metaxalone is still pending.

## 2020-01-08 NOTE — Telephone Encounter (Signed)
Received a clinical request response sheet. Sheet has been completed and faxed back to [facility]. Will await determination.

## 2020-01-19 DIAGNOSIS — E782 Mixed hyperlipidemia: Secondary | ICD-10-CM | POA: Diagnosis not present

## 2020-01-19 DIAGNOSIS — N183 Chronic kidney disease, stage 3 unspecified: Secondary | ICD-10-CM | POA: Diagnosis not present

## 2020-01-19 DIAGNOSIS — I129 Hypertensive chronic kidney disease with stage 1 through stage 4 chronic kidney disease, or unspecified chronic kidney disease: Secondary | ICD-10-CM | POA: Diagnosis not present

## 2020-01-19 DIAGNOSIS — G479 Sleep disorder, unspecified: Secondary | ICD-10-CM | POA: Diagnosis not present

## 2020-01-31 DIAGNOSIS — L821 Other seborrheic keratosis: Secondary | ICD-10-CM | POA: Diagnosis not present

## 2020-01-31 DIAGNOSIS — L723 Sebaceous cyst: Secondary | ICD-10-CM | POA: Diagnosis not present

## 2020-01-31 DIAGNOSIS — D045 Carcinoma in situ of skin of trunk: Secondary | ICD-10-CM | POA: Diagnosis not present

## 2020-01-31 DIAGNOSIS — D225 Melanocytic nevi of trunk: Secondary | ICD-10-CM | POA: Diagnosis not present

## 2020-01-31 DIAGNOSIS — L578 Other skin changes due to chronic exposure to nonionizing radiation: Secondary | ICD-10-CM | POA: Diagnosis not present

## 2020-01-31 DIAGNOSIS — D485 Neoplasm of uncertain behavior of skin: Secondary | ICD-10-CM | POA: Diagnosis not present

## 2020-02-21 ENCOUNTER — Encounter: Payer: Self-pay | Admitting: Internal Medicine

## 2020-02-21 ENCOUNTER — Ambulatory Visit: Payer: Medicare Other | Admitting: Internal Medicine

## 2020-02-21 VITALS — BP 124/68 | HR 81 | Ht 65.0 in | Wt 151.0 lb

## 2020-02-21 DIAGNOSIS — R159 Full incontinence of feces: Secondary | ICD-10-CM

## 2020-02-21 DIAGNOSIS — K6289 Other specified diseases of anus and rectum: Secondary | ICD-10-CM

## 2020-02-21 DIAGNOSIS — K649 Unspecified hemorrhoids: Secondary | ICD-10-CM

## 2020-02-21 DIAGNOSIS — Z86010 Personal history of colonic polyps: Secondary | ICD-10-CM

## 2020-02-21 DIAGNOSIS — K219 Gastro-esophageal reflux disease without esophagitis: Secondary | ICD-10-CM

## 2020-02-21 MED ORDER — HYDROCORTISONE (PERIANAL) 2.5 % EX CREA: 1.0000 "application " | TOPICAL_CREAM | Freq: Every day | CUTANEOUS | 1 refills | Status: DC

## 2020-02-21 NOTE — Progress Notes (Signed)
HISTORY OF PRESENT ILLNESS:  Rachael Webster is a pleasant 74 y.o. female with past medical history as outlined below who has been followed in this office previously for surveillance colonoscopy (history of adenomatous colon polyps as well as sessile serrated polyps), continence, and abdominal pain.  She has not been seen since her previous colonoscopy and upper endoscopy February 16, 2017.  Colonoscopy revealed multiple polyps which were diminutive adenomas and sessile serrated polyps.  She was also noted to have some nonspecific friability in the distal rectum which was felt to be endoscopically consistent with prolapse type change.  Biopsies were supported that diagnosis.  Finally, incidental diverticulosis and internal hemorrhoids.  Patient has had chronic problems with fecal incontinence.  She is not aware of incontinent episodes.  She wears protective undergarments.  She describes her stools as soft.  She does take Metamucil 1 tablespoon twice daily.  She also describes prolapsing hemorrhoids which are uncomfortable at times.  She does not strain to defecate.  She will see occasional blood on the tissue and rarely dripping into the toilet bowl.  No upper GI complaints aside from bloating and belching.  For GERD she is maintained on PPI therapy which is quite effective.  No relevant interval data such as blood work or imaging.  Is status post cholecystectomy and hysterectomy.  She has completed her Covid vaccination series  REVIEW OF SYSTEMS:  All non-GI ROS negative unless stated in HPI except for visual change, fatigue, headaches, muscle cramps, sleeping problems  Past Medical History:  Diagnosis Date  . Abdominal pain    middle of abd, 1 time only  . Depression   . Fecal incontinence    metamucill helps  . FH: stomach ulcer    2001/ per pt, personal hx of ulcer, no family  . GERD (gastroesophageal reflux disease)   . Headache, chronic daily   . Hearing loss    Wears Bil hearing  aids.  . Hyperlipidemia   . Hypertension   . Osteopenia   . Post-operative nausea and vomiting 1986   only 1 time    Past Surgical History:  Procedure Laterality Date  . ABDOMINAL HYSTERECTOMY  1989  . BREAST BIOPSY Left 2016  . BREAST BIOPSY Right 01/06/2018   x2  . BREAST EXCISIONAL BIOPSY Right   . CHOLECYSTECTOMY  2007  . [place]   right  . TONSILLECTOMY  1956  . TUBAL LIGATION  1976  . WRIST SURGERY  11/26/2016   right wrist    Social History Rachael Webster  reports that she has never smoked. She has never used smokeless tobacco. She reports that she does not drink alcohol and does not use drugs.  family history is not on file. She was adopted.  Allergies  Allergen Reactions  . Amitriptyline Hcl     Opposite effect: hyperactive  . Asa [Aspirin]     History of stomach ulcer       PHYSICAL EXAMINATION: Vital signs: BP 124/68   Pulse 81   Ht 5\' 5"  (1.651 m)   Wt 151 lb (68.5 kg)   SpO2 96%   BMI 25.13 kg/m   Constitutional: generally well-appearing, no acute distress Psychiatric: alert and oriented x3, cooperative Eyes: extraocular movements intact, anicteric, conjunctiva pink Mouth: oral pharynx moist, no lesions Neck: supple no lymphadenopathy Cardiovascular: heart regular rate and rhythm, no murmur Lungs: clear to auscultation bilaterally Abdomen: soft, nontender, nondistended, no obvious ascites, no peritoneal signs, normal bowel sounds, no  organomegaly Rectal: No external abnormalities.  Friable internal hemorrhoids.  No mass or tenderness.  Brown stool.  No prolapse with Valsalva.  Decreased tone Extremities: no cyanosis, or lower extremity edema bilaterally Skin: no lesions on visible extremities Neuro: No focal deficits.  Cranial nerves intact  ASSESSMENT:  1.  Symptomatic internal hemorrhoids. 2.  Fecal incontinence 3.  Previous colonoscopy 2018 with diminutive adenomas and sessile serrated polyps.  Also  incidental diverticulosis and prolapse-like changes of the rectal mucosa 4.  GERD.  Symptoms controlled PPI  PLAN:  1.  Prescribed Anusol suppositories to be taken 1 at night.  This to help with hemorrhoidal swelling in anticipation of banding procedure (see below) 2.  Increase fiber to see if stools could be bulked up a bit more.  She agrees 3.  Continue protective undergarments.  Difficult situation with lack of awareness of incontinence due to decreased rectal tone. 4.  Refer to Dr. Hilarie Fredrickson for [place] hemorrhoidal banding.  Arranged.  I described with her the nature of the procedure and its risks.  Also, provided the standard patient brochure 5.  Surveillance colonoscopy around 2023 6.  Continue reflux precautions and omeprazole therapy 7.  Interval GI follow-up to be determined

## 2020-02-21 NOTE — Patient Instructions (Addendum)
We have sent the following medications to your pharmacy for you to pick up at your convenience: Anusol HC cream  Purchase Preparation H suppositories over the counter.  Put a small amount of the cream on one before inserting rectally at bedtime.  You have an appointment with Dr. Hilarie Fredrickson for a hemorrhoid banding on 04/26/2020 at 3:40pm.  Increase your daily fiber suupplement

## 2020-02-22 DIAGNOSIS — R69 Illness, unspecified: Secondary | ICD-10-CM | POA: Diagnosis not present

## 2020-02-28 DIAGNOSIS — Z0120 Encounter for dental examination and cleaning without abnormal findings: Secondary | ICD-10-CM | POA: Diagnosis not present

## 2020-03-11 DIAGNOSIS — D099 Carcinoma in situ, unspecified: Secondary | ICD-10-CM | POA: Diagnosis not present

## 2020-03-18 ENCOUNTER — Encounter: Payer: Self-pay | Admitting: *Deleted

## 2020-03-30 DIAGNOSIS — Z23 Encounter for immunization: Secondary | ICD-10-CM | POA: Diagnosis not present

## 2020-04-26 ENCOUNTER — Ambulatory Visit: Payer: Medicare Other | Admitting: Internal Medicine

## 2020-04-26 ENCOUNTER — Encounter: Payer: Self-pay | Admitting: Internal Medicine

## 2020-04-26 VITALS — BP 140/80 | HR 86 | Ht 65.5 in | Wt 151.0 lb

## 2020-04-26 DIAGNOSIS — R151 Fecal smearing: Secondary | ICD-10-CM | POA: Diagnosis not present

## 2020-04-26 DIAGNOSIS — K642 Third degree hemorrhoids: Secondary | ICD-10-CM

## 2020-04-26 NOTE — Patient Instructions (Addendum)
You have been scheduled for your hemorrhoidal banding #2 on 05/29/20 at 10:50 am.  HEMORRHOID BANDING PROCEDURE    FOLLOW-UP CARE   1. The procedure you have had should have been relatively painless since the banding of the area involved does not have nerve endings and there is no pain sensation.  The rubber band cuts off the blood supply to the hemorrhoid and the band may fall off as soon as 48 hours after the banding (the band may occasionally be seen in the toilet bowl following a bowel movement). You may notice a temporary feeling of fullness in the rectum which should respond adequately to plain Tylenol or Motrin.  2. Following the banding, avoid strenuous exercise that evening and resume full activity the next day.  A sitz bath (soaking in a warm tub) or bidet is soothing, and can be useful for cleansing the area after bowel movements.     3. To avoid constipation, take two tablespoons of natural wheat bran, natural oat bran, flax, Benefiber or any over the counter fiber supplement and increase your water intake to 7-8 glasses daily.    4. Unless you have been prescribed anorectal medication, do not put anything inside your rectum for two weeks: No suppositories, enemas, fingers, etc.  5. Occasionally, you may have more bleeding than usual after the banding procedure.  This is often from the untreated hemorrhoids rather than the treated one.  Don't be concerned if there is a tablespoon or so of blood.  If there is more blood than this, lie flat with your bottom higher than your head and apply an ice pack to the area. If the bleeding does not stop within a half an hour or if you feel faint, call our office at [phone] or go to the emergency room.  6. Problems are not common; however, if there is a substantial amount of bleeding, severe pain, chills, fever or difficulty passing urine (very rare) or other problems, you should call us at [phone] or report to the nearest emergency  room.  7. Do not stay seated continuously for more than 2-3 hours for a day or two after the procedure.  Tighten your buttock muscles 10-15 times every two hours and take 10-15 deep breaths every 1-2 hours.  Do not spend more than a few minutes on the toilet if you cannot empty your bowel; instead re-visit the toilet at a later time.

## 2020-04-28 NOTE — Progress Notes (Signed)
  Rachael Webster is a 74 year old female patient of Dr. Blanch Media who presents for hemorrhoidal banding for symptomatic prolapsing internal hemorrhoids associated with fecal smearing.  She was seen by Dr. [name] on 02/21/2020 to discuss symptomatic internal hemorrhoids and fecal incontinence/smearing.  She previously had a colonoscopy in 2018 with diminutive adenomas and sessile serrated polyps removed.  There was evidence of mucosal prolapse change in the distal rectum.  She reports longstanding hemorrhoidal symptoms which for her are prolapsing internal hemorrhoids.  No rectal bleeding or itching.  She does have leakage and seepage of stool per rectum.  She has never had hemorrhoids treated before.  They will ache and bother her after working outside and she will have to wear a pad in her underwear.   PROCEDURE NOTE:  The patient presents with symptomatic grade 3 internal hemorrhoids, requesting rubber band ligation of her hemorrhoidal disease.  All risks, benefits and alternative forms of therapy were described and informed consent was obtained.   The anorectum was pre-medicated with 0.125% nitroglycerin ointment The decision was made to band the LL internal hemorrhoid, and the [place] was used to perform band ligation without complication.   Digital anorectal examination was then performed to assure proper positioning of the band, and to adjust the banded tissue as required.  The patient was discharged home without pain or other issues.  Dietary and behavioral recommendations were given and along with follow-up instructions.      The patient will return as scheduled for follow-up and possible additional banding as required. No complications were encountered and the patient tolerated the procedure well.

## 2020-05-24 ENCOUNTER — Other Ambulatory Visit: Payer: Self-pay | Admitting: Neurology

## 2020-05-24 MED ORDER — VENLAFAXINE HCL ER 75 MG PO CP24: 225.0000 mg | ORAL_CAPSULE | Freq: Every day | ORAL | 1 refills | Status: DC

## 2020-05-29 ENCOUNTER — Ambulatory Visit: Payer: Medicare Other | Admitting: Internal Medicine

## 2020-05-29 ENCOUNTER — Encounter: Payer: Self-pay | Admitting: Internal Medicine

## 2020-05-29 VITALS — BP 138/76 | HR 72 | Ht 65.5 in | Wt 150.0 lb

## 2020-05-29 DIAGNOSIS — K642 Third degree hemorrhoids: Secondary | ICD-10-CM | POA: Diagnosis not present

## 2020-05-29 NOTE — Progress Notes (Signed)
Rachael Webster is a 74 year old female who returns for additional hemorrhoidal banding.  Referred by Dr. [name].  Symptoms prior to banding include hemorrhoidal prolapse symptoms with fecal smearing.  Also discomfort.  Initial banding was 04/26/2020 to the left lateral internal hemorrhoid.  She reports significant improvement after 1 banding and she said it is almost like "a miracle".  PROCEDURE NOTE:  The patient presents with symptomatic grade 3 internal hemorrhoids, requesting rubber band ligation of her hemorrhoidal disease.  All risks, benefits and alternative forms of therapy were described and informed consent was obtained.   The anorectum was pre-medicated with 0.125% nitroglycerin ointment The decision was made to band the RP (left lateral banded at visit #1) internal hemorrhoid, and the [place] was used to perform band ligation without complication.   Digital anorectal examination was then performed to assure proper positioning of the band, and to adjust the banded tissue as required.  The patient was discharged home without pain or other issues.  Dietary and behavioral recommendations were given and along with follow-up instructions.     The patient will return as scheduled for follow-up and possible additional banding as required. No complications were encountered and the patient tolerated the procedure well.

## 2020-05-29 NOTE — Patient Instructions (Signed)
If you are age 75 or older, your body mass index should be between 23-30. Your Body mass index is 24.58 kg/m. If this is out of the aforementioned range listed, please consider follow up with your Primary Care Provider.  If you are age 58 or younger, your body mass index should be between 19-25. Your Body mass index is 24.58 kg/m. If this is out of the aformentioned range listed, please consider follow up with your Primary Care Provider.   HEMORRHOID BANDING PROCEDURE    FOLLOW-UP CARE   1. The procedure you have had should have been relatively painless since the banding of the area involved does not have nerve endings and there is no pain sensation.  The rubber band cuts off the blood supply to the hemorrhoid and the band may fall off as soon as 48 hours after the banding (the band may occasionally be seen in the toilet bowl following a bowel movement). You may notice a temporary feeling of fullness in the rectum which should respond adequately to plain Tylenol or Motrin.  2. Following the banding, avoid strenuous exercise that evening and resume full activity the next day.  A sitz bath (soaking in a warm tub) or bidet is soothing, and can be useful for cleansing the area after bowel movements.     3. To avoid constipation, take two tablespoons of natural wheat bran, natural oat bran, flax, Benefiber or any over the counter fiber supplement and increase your water intake to 7-8 glasses daily.    4. Unless you have been prescribed anorectal medication, do not put anything inside your rectum for two weeks: No suppositories, enemas, fingers, etc.  5. Occasionally, you may have more bleeding than usual after the banding procedure.  This is often from the untreated hemorrhoids rather than the treated one.  Don't be concerned if there is a tablespoon or so of blood.  If there is more blood than this, lie flat with your bottom higher than your head and apply an ice pack to the area. If the bleeding  does not stop within a half an hour or if you feel faint, call our office at [phone] or go to the emergency room.  6. Problems are not common; however, if there is a substantial amount of bleeding, severe pain, chills, fever or difficulty passing urine (very rare) or other problems, you should call us at [phone] or report to the nearest emergency room.  7. Do not stay seated continuously for more than 2-3 hours for a day or two after the procedure.  Tighten your buttock muscles 10-15 times every two hours and take 10-15 deep breaths every 1-2 hours.  Do not spend more than a few minutes on the toilet if you cannot empty your bowel; instead re-visit the toilet at a later time.    We have scheduled you for the 3rd banding on 07/11/20 at 4:00 PM.

## 2020-05-31 ENCOUNTER — Telehealth: Payer: Self-pay | Admitting: Internal Medicine

## 2020-05-31 NOTE — Telephone Encounter (Signed)
Pt states the band fell out at about 28 hours. Pt is afraid the procedure will not work since it fell out. Let her know Dr. Hilarie Fredrickson would be notified and we will get back to her once he has reviewed the message.

## 2020-05-31 NOTE — Telephone Encounter (Signed)
Inbound call from patient requesting a call back.  States that band fell off within 28hrs from having her procedure done on 05/29/20 with Dr. Ardis Hughs ands wants to make sure that is ok since she read on the follow-up care that usually it falls off within 48hrs.  States can leave detailed voicemail if she doesn't answer.

## 2020-06-03 NOTE — Telephone Encounter (Signed)
Spoke with pt and she is aware.

## 2020-06-03 NOTE — Telephone Encounter (Signed)
This is okay and I do not think this will make the procedure less effective She should not worry Thanks JMP

## 2020-06-24 NOTE — Telephone Encounter (Signed)
Ok, noted Nothing further until next banding Dottie, if spot opens then perhaps we can move her up for repeat banding sooner than current scheduled. Thanks

## 2020-07-01 NOTE — Telephone Encounter (Signed)
I attempted to have patient come for banding on 07/03/19, however, she is unable to come for this appointment. She will just keep her banding appointment on 07/11/20.

## 2020-07-02 ENCOUNTER — Encounter: Payer: Self-pay | Admitting: Internal Medicine

## 2020-07-02 ENCOUNTER — Encounter: Payer: Medicare Other | Admitting: Internal Medicine

## 2020-07-02 ENCOUNTER — Ambulatory Visit: Payer: Medicare Other | Admitting: Internal Medicine

## 2020-07-02 VITALS — BP 142/82 | HR 78 | Ht 65.5 in | Wt 151.0 lb

## 2020-07-02 DIAGNOSIS — K642 Third degree hemorrhoids: Secondary | ICD-10-CM

## 2020-07-02 NOTE — Progress Notes (Signed)
Rachael Webster is a 75 year old female who returns for additional hemorrhoidal banding.  Symptoms prior to hemorrhoidal banding include prolapse and fecal smearing. Also perianal discomfort.  Initial hemorrhoidal banding was 04/26/2020 to the left lateral internal hemorrhoid. She returned on 05/29/2020 for the right posterior internal hemorrhoid. She called 2 days later to report that the band dislodged 28 hours after her procedure. For her this was sooner than when the initial band was shifted. She also had prolapse shortly after this on 06/20/2020 and she was worried that the second banding may have been less effective.  Today symptoms have completely resolved.   PROCEDURE NOTE:  The patient presents with symptomatic grade 3 internal hemorrhoids, requesting rubber band ligation of her hemorrhoidal disease.  All risks, benefits and alternative forms of therapy were described and informed consent was obtained.   The anorectum was pre-medicated with 0.125% nitroglycerin ointment The decision was made to band the RA (LL and RP banded previously) internal hemorrhoid, and the [place] was used to perform band ligation without complication.   Digital anorectal examination was then performed to assure proper positioning of the band, and to adjust the banded tissue as required.  The patient was discharged home without pain or other issues.  Dietary and behavioral recommendations were given and along with follow-up instructions.    The patient will return as needed  for follow-up and possible additional banding as required. Should prolapse continue to be an issue I asked that she follow-up with me and we could consider performing anoscopy and repeat banding if necessary.  No complications were encountered and the patient tolerated the procedure well.

## 2020-07-02 NOTE — Patient Instructions (Addendum)
Please follow up with Dr Hilarie Fredrickson as needed.  If you are age 76 or older, your body mass index should be between 23-30. Your Body mass index is 24.75 kg/m. If this is out of the aforementioned range listed, please consider follow up with your Primary Care Provider.  If you are age 51 or younger, your body mass index should be between 19-25. Your Body mass index is 24.75 kg/m. If this is out of the aformentioned range listed, please consider follow up with your Primary Care Provider.   HEMORRHOID BANDING PROCEDURE    FOLLOW-UP CARE   1. The procedure you have had should have been relatively painless since the banding of the area involved does not have nerve endings and there is no pain sensation.  The rubber band cuts off the blood supply to the hemorrhoid and the band may fall off as soon as 48 hours after the banding (the band may occasionally be seen in the toilet bowl following a bowel movement). You may notice a temporary feeling of fullness in the rectum which should respond adequately to plain Tylenol or Motrin.  2. Following the banding, avoid strenuous exercise that evening and resume full activity the next day.  A sitz bath (soaking in a warm tub) or bidet is soothing, and can be useful for cleansing the area after bowel movements.     3. To avoid constipation, take two tablespoons of natural wheat bran, natural oat bran, flax, Benefiber or any over the counter fiber supplement and increase your water intake to 7-8 glasses daily.    4. Unless you have been prescribed anorectal medication, do not put anything inside your rectum for two weeks: No suppositories, enemas, fingers, etc.  5. Occasionally, you may have more bleeding than usual after the banding procedure.  This is often from the untreated hemorrhoids rather than the treated one.  Don't be concerned if there is a tablespoon or so of blood.  If there is more blood than this, lie flat with your bottom higher than your head and  apply an ice pack to the area. If the bleeding does not stop within a half an hour or if you feel faint, call our office at [phone] or go to the emergency room.  6. Problems are not common; however, if there is a substantial amount of bleeding, severe pain, chills, fever or difficulty passing urine (very rare) or other problems, you should call us at [phone] or report to the nearest emergency room.  7. Do not stay seated continuously for more than 2-3 hours for a day or two after the procedure.  Tighten your buttock muscles 10-15 times every two hours and take 10-15 deep breaths every 1-2 hours.  Do not spend more than a few minutes on the toilet if you cannot empty your bowel; instead re-visit the toilet at a later time.

## 2020-07-11 ENCOUNTER — Encounter: Payer: Medicare Other | Admitting: Internal Medicine

## 2020-08-06 DIAGNOSIS — N183 Chronic kidney disease, stage 3 unspecified: Secondary | ICD-10-CM | POA: Diagnosis not present

## 2020-08-06 DIAGNOSIS — I129 Hypertensive chronic kidney disease with stage 1 through stage 4 chronic kidney disease, or unspecified chronic kidney disease: Secondary | ICD-10-CM | POA: Diagnosis not present

## 2020-08-06 DIAGNOSIS — Z0000 Encounter for general adult medical examination without abnormal findings: Secondary | ICD-10-CM | POA: Diagnosis not present

## 2020-08-06 DIAGNOSIS — E782 Mixed hyperlipidemia: Secondary | ICD-10-CM | POA: Diagnosis not present

## 2020-09-26 ENCOUNTER — Other Ambulatory Visit: Payer: Self-pay | Admitting: Family Medicine

## 2020-09-26 DIAGNOSIS — Z1231 Encounter for screening mammogram for malignant neoplasm of breast: Secondary | ICD-10-CM

## 2020-11-11 DIAGNOSIS — L309 Dermatitis, unspecified: Secondary | ICD-10-CM | POA: Diagnosis not present

## 2020-11-19 ENCOUNTER — Other Ambulatory Visit: Payer: Self-pay

## 2020-11-19 ENCOUNTER — Ambulatory Visit
Admission: RE | Admit: 2020-11-19 | Discharge: 2020-11-19 | Disposition: A | Discharge status: Home / Self Care | Payer: Medicare Other | Source: Ambulatory Visit | Attending: Family Medicine | Admitting: Family Medicine

## 2020-11-19 DIAGNOSIS — Z1231 Encounter for screening mammogram for malignant neoplasm of breast: Secondary | ICD-10-CM | POA: Diagnosis not present

## 2020-11-27 DIAGNOSIS — W19XXXA Unspecified fall, initial encounter: Secondary | ICD-10-CM | POA: Diagnosis not present

## 2020-11-27 DIAGNOSIS — R1110 Vomiting, unspecified: Secondary | ICD-10-CM | POA: Diagnosis not present

## 2020-11-27 DIAGNOSIS — R531 Weakness: Secondary | ICD-10-CM | POA: Diagnosis not present

## 2020-11-27 DIAGNOSIS — R0781 Pleurodynia: Secondary | ICD-10-CM | POA: Diagnosis not present

## 2020-12-12 ENCOUNTER — Telehealth: Payer: Self-pay | Admitting: Internal Medicine

## 2020-12-12 NOTE — Telephone Encounter (Signed)
Patient calling to discuss sxs. Pt states her appt is 09/22. Pt wants to know how to treat sxs.. Plz advise   thanks

## 2020-12-13 NOTE — Telephone Encounter (Signed)
Pt calling states she has had hem banding done and sometimes she has issues with protrusion. States she is having that now and they are not going back in. Reports Dr. Hilarie Fredrickson told her to call and he would work her in to be seen. Pt scheduled to see Dr. Hilarie Fredrickson 02/18/21 at 4pm. Pt aware of appt.

## 2020-12-19 DIAGNOSIS — H5203 Hypermetropia, bilateral: Secondary | ICD-10-CM | POA: Diagnosis not present

## 2020-12-30 ENCOUNTER — Other Ambulatory Visit: Payer: Self-pay | Admitting: *Deleted

## 2020-12-30 MED ORDER — VENLAFAXINE HCL ER 75 MG PO CP24: 225.0000 mg | ORAL_CAPSULE | Freq: Every day | ORAL | 0 refills | Status: DC

## 2021-01-01 ENCOUNTER — Encounter: Payer: Self-pay | Admitting: Adult Health

## 2021-01-01 ENCOUNTER — Ambulatory Visit: Payer: Medicare Other | Admitting: Adult Health

## 2021-01-01 ENCOUNTER — Ambulatory Visit: Payer: Medicare Other | Admitting: Neurology

## 2021-01-01 VITALS — BP 135/85 | HR 84 | Ht 65.5 in | Wt 150.0 lb

## 2021-01-01 DIAGNOSIS — M5481 Occipital neuralgia: Secondary | ICD-10-CM

## 2021-01-01 DIAGNOSIS — G43009 Migraine without aura, not intractable, without status migrainosus: Secondary | ICD-10-CM

## 2021-01-01 NOTE — Patient Instructions (Signed)
Your Plan:  Continue Lamictal 100 mg Twice a day Continue Effexor  If your symptoms worsen or you develop new symptoms please let us know.    Thank you for coming to see [place] at [facility]. I hope we have been able to provide you high quality care today.  You may receive a patient satisfaction survey over the next few weeks. We would appreciate your feedback and comments so that we may continue to improve ourselves and the health of our patients.

## 2021-01-01 NOTE — Progress Notes (Addendum)
PATIENT: Rachael Webster DOB: 17-Nov-1945  REASON FOR VISIT: follow up HISTORY FROM: patient PRIMARY NEUROLOGIST: Dr. Jaynee Eagles   HISTORY OF PRESENT ILLNESS: Today 01/01/21:  [name] is a 75 year old female with a history of tension type headaches and migraine headaches.  She returns today for follow-up.  Overall she states that she has been doing well.  She states that her last tension headache was over 2 weeks ago.  She takes Lamictal 100 mg twice a day and Effexor 225 mg daily.  She finds both of these medications beneficial.  She also uses Skelaxin on occasion and reports that it helps "some."  She has scheduled an appointment with psychiatry to discuss her sleep her appointment is in September.  She returns today for an evaluation.  HISTORY (copied from Dr. Cathren Laine note) [name] is a 75 y.o. female here as requested by Amil Amen, MD for migraines. PMHx migraine with aura and without status migrainosus not intractable, persistent disorder of initiating or maintaining sleep.  I reviewed Dr. Delene Ruffini notes, as well as Dr. Joanna Puff notes who was her previous neurologist, patient doing well on Effexor and Lamictal.  She described her migraines as severe, moderate mild, per Dr. Joanna Puff notes, location of the headache was the back of the head and neck which would last for days, dull tight, sensitivity to noise and odor, no nausea vomiting sensitivity to light or worsening with movement activities or dizziness.  Headache triggers include fatigue, worrying, odors, medications, hormone changes and emotional stress.  At Dr. Joanna Puff last appointment overall she was doing well, small tension headaches from time to time, it appears she is on Ambien at bedtime   From a thorough review of records medications that patient has tried that can be used in migraine management include: Aspirin, capsaicin, Tylenol for, Darvocet, Excedrin, tramadol, DHE injection, Imitrex,  Midrin, zolmitriptan, Inderal/propranolol, Norvasc/amlodipine and atenolol, Benadryl, Dramamine and Phenergan, Aleve, Naprosyn, indomethacin, Toradol, Flexeril, Depakote, Neurontin/gabapentin, topiramate, prednisone, melatonin, ramelteon and Silenor for sleeping, paroxetine, fluoxetine, mirtazapine and doxepin, Botox, Myobloc and trigger point injections.   For years, "many many many" years she lived with rebound headache and did not know it. She was taking daily medications and daily hydrocodone, when she found out it made a huge difference. Dr. Sima Matas stopped her daily oxy and it really helped. She significantly improved after that. She has headaches in the back of the head bilaterally and working in the yard will irritate her neck and when that happens metaxalone. She has a dull pressure in the back of the head, no shooting or throbbing but will radiate to behind the eyes, she may have it once a week however worse over the last 6 weeks due to stress. But usually her baseline is once a week, it can last a whole day even treated with tylenol. Over the last few weeks she has been taking medications more. Not throbbing, no light sensitivity but can ave sound sensitivity when the headache is very bad, she has a lot of neck pain usually especially with a headache. Neck pain triggers especially is sleeping on it the wrong way or ding a lot of yard work. She says Lamotrigine also helped with headaches y possibly helping with her mood as she had back headaches once a month with some depression and the lamotrigine.   She has very poor sleep, she uses [place] and she doesn't want to, she goes to bed at 1130 and takes an [place], she will read for  45 minutes and then she will lay down and she is wide awake. She will lay there as long as she will tolerate it, she gets up at 130-2am and plays a computer game, then she goes to bed and sleeps late. I recommended sleep counseling, she had a sleep study.    Reviewed notes, labs  and imaging from outside physicians, which showed:    REVIEW OF SYSTEMS: Out of a complete 14 system review of symptoms, the patient complains only of the following symptoms, and all other reviewed systems are negative.  See HPI  ALLERGIES: Allergies  Allergen Reactions   Amitriptyline Hcl     Opposite effect: hyperactive   Asa [Aspirin]     History of stomach ulcer    HOME MEDICATIONS: Outpatient Medications Prior to Visit  Medication Sig Dispense Refill   amLODipine (NORVASC) 10 MG tablet Take 10 mg by mouth daily.      Calcium Carbonate-Vitamin D (CALCIUM 600/VITAMIN D PO) Take by mouth in the morning and at bedtime.      Cholecalciferol (VITAMIN D) 2000 UNITS CAPS Take by mouth daily.     clonazePAM (KLONOPIN) 0.5 MG tablet Take 0.5 mg by mouth 2 (two) times daily as needed.      ezetimibe (ZETIA) 10 MG tablet Take 10 mg by mouth at bedtime.     fish oil-omega-3 fatty acids 1000 MG capsule Take 2 g by mouth daily.      fluvastatin XL (LESCOL XL) 80 MG 24 hr tablet Take 80 mg by mouth daily.     lamoTRIgine 100 MG TBDP Take 1 tablet (100 mg total) by mouth 2 (two) times daily. 180 tablet 4   metaxalone (SKELAXIN) 800 MG tablet Take 1 tablet (800 mg total) by mouth as needed. 30 tablet 11   Multiple Vitamins-Minerals (CENTRUM SILVER ADULT 50+ PO) Take by mouth.     omeprazole (PRILOSEC) 20 MG capsule Take 20 mg by mouth daily.     venlafaxine XR (EFFEXOR-XR) 75 MG 24 hr capsule Take 3 capsules (225 mg total) by mouth daily. 180 capsule 0   hydrocortisone (ANUSOL-HC) 2.5 % rectal cream Place 1 application rectally at bedtime. 30 g 1   No facility-administered medications prior to visit.    PAST MEDICAL HISTORY: Past Medical History:  Diagnosis Date   Abdominal pain    middle of abd, 1 time only   Adenomatous colon polyp    Depression    Fecal incontinence    metamucill helps   FH: stomach ulcer    2001/ per pt, personal hx of ulcer, no family   GERD (gastroesophageal  reflux disease)    Headache, chronic daily    Hearing loss    Wears Bil hearing aids.   Hyperlipidemia    Hypertension    Internal hemorrhoids    Osteopenia    Post-operative nausea and vomiting 1986   only 1 time    PAST SURGICAL HISTORY: Past Surgical History:  Procedure Laterality Date   ABDOMINAL HYSTERECTOMY  1989   BREAST BIOPSY Left 2016   BREAST BIOPSY Right 01/06/2018   x2   BREAST EXCISIONAL BIOPSY Right    CHOLECYSTECTOMY  2007   ROTATOR CUFF REPAIR  1998   right   [place]   WRIST SURGERY  11/26/2016   right wrist    FAMILY HISTORY: Family History  Adopted: Yes  Problem Relation Age of Onset   Colon cancer Neg Hx  Stomach cancer Neg Hx     SOCIAL HISTORY: Social History   Socioeconomic History   Marital status: Married    Spouse name: Not on file   Number of children: Not on file   Years of education: Not on file   Highest education level: Not on file  Occupational History   Not on file  Tobacco Use   Smoking status: Never   Smokeless tobacco: Never  Vaping Use   Vaping Use: Never used  Substance and Sexual Activity   Alcohol use: No   Drug use: No   Sexual activity: Not on file  Other Topics Concern   Not on file  Social History Narrative   Lives at home with spouse   Left handed   Caffeine: maybe 4 oz per day    Social Determinants of Health   Financial Resource Strain: Not on file  Food Insecurity: Not on file  Transportation Needs: Not on file  Physical Activity: Not on file  Stress: Not on file  Social Connections: Not on file  Intimate Partner Violence: Not on file      PHYSICAL EXAM  Vitals:   01/01/21 1059  BP: 135/85  Pulse: 84  Weight: 150 lb (68 kg)  Height: 5' 5.5" (1.664 m)   Body mass index is 24.58 kg/m.  Generalized: Well developed, in no acute distress   Neurological examination  Mentation: Alert oriented to time, place, history taking. Follows all commands  speech and language fluent Cranial nerve II-XII: Pupils were equal round reactive to light. Extraocular movements were full, visual field were full on confrontational test. Facial sensation and strength were normal. Uvula tongue midline. Head turning and shoulder shrug  were normal and symmetric. Motor: The motor testing reveals 5 over 5 strength of all 4 extremities. Good symmetric motor tone is noted throughout.  Sensory: Sensory testing is intact to soft touch on all 4 extremities. No evidence of extinction is noted.  Coordination: Cerebellar testing reveals good finger-nose-finger and heel-to-shin bilaterally.  Gait and station: Gait is normal.  Reflexes: Deep tendon reflexes are symmetric and normal bilaterally.   DIAGNOSTIC DATA (LABS, IMAGING, TESTING) - I reviewed patient records, labs, notes, testing and imaging myself where available.  Lab Results  Component Value Date   WBC 6.7 11/07/2008   HGB 13.0 11/07/2008   HCT 38.4 11/07/2008   MCV 98.5 11/07/2008   PLT 211 11/07/2008      Component Value Date/Time   NA 142 11/07/2008 1345   K 4.3 11/07/2008 1345   CL 110 11/07/2008 1345   CO2 28 11/07/2008 1345   GLUCOSE 122 (H) 11/07/2008 1345   BUN 14 11/07/2008 1345   CREATININE 0.73 11/07/2008 1345   CALCIUM 10.4 11/07/2008 1345   GFRNONAA >60 11/07/2008 1345   GFRAA  11/07/2008 1345    >60        The eGFR has been calculated using the MDRD equation. This calculation has not been validated in all clinical situations. eGFR's persistently <60 mL/min signify possible Chronic Kidney Disease.      ASSESSMENT AND PLAN 75 y.o. year old female  has a past medical history of Abdominal pain, Adenomatous colon polyp, Depression, Fecal incontinence, FH: stomach ulcer, GERD (gastroesophageal reflux disease), Headache, chronic daily, Hearing loss, Hyperlipidemia, Hypertension, Internal hemorrhoids, Osteopenia, and Post-operative nausea and vomiting (1986). here with:  1.   Migraine headaches 2.  Tension type headaches  --Continue Lamictal 100 mg twice a day --Continue Effexor 225 mg daily --Advised  if symptoms worsen or she develops new symptoms she should let us know --Follow-up in 1 year or sooner if needed     Ward Givens, MSN, NP-C 01/01/2021, 11:12 AM [facility] [address] [place], [place] [address] [phone]  agree with assessment and plan as stated.     Sarina Ill, MD [facility]

## 2021-01-02 IMAGING — MG DIGITAL SCREENING BILAT W/ TOMO W/ CAD
6 of 10 series · 6 of 30 positions shown · non-contrast
Comparison: Previous exam(s).

CLINICAL DATA: Screening.

EXAM:
DIGITAL SCREENING BILATERAL MAMMOGRAM WITH TOMO AND CAD

[L CC synth-2D]
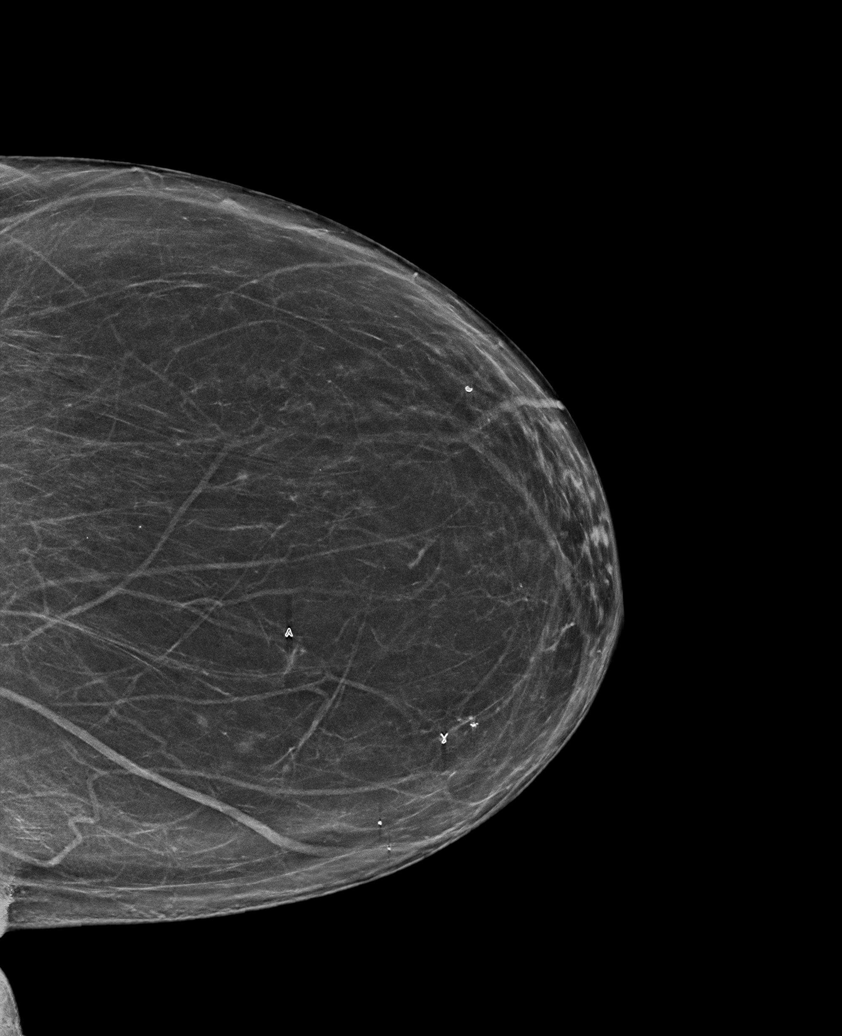

[L MLO synth-2D]
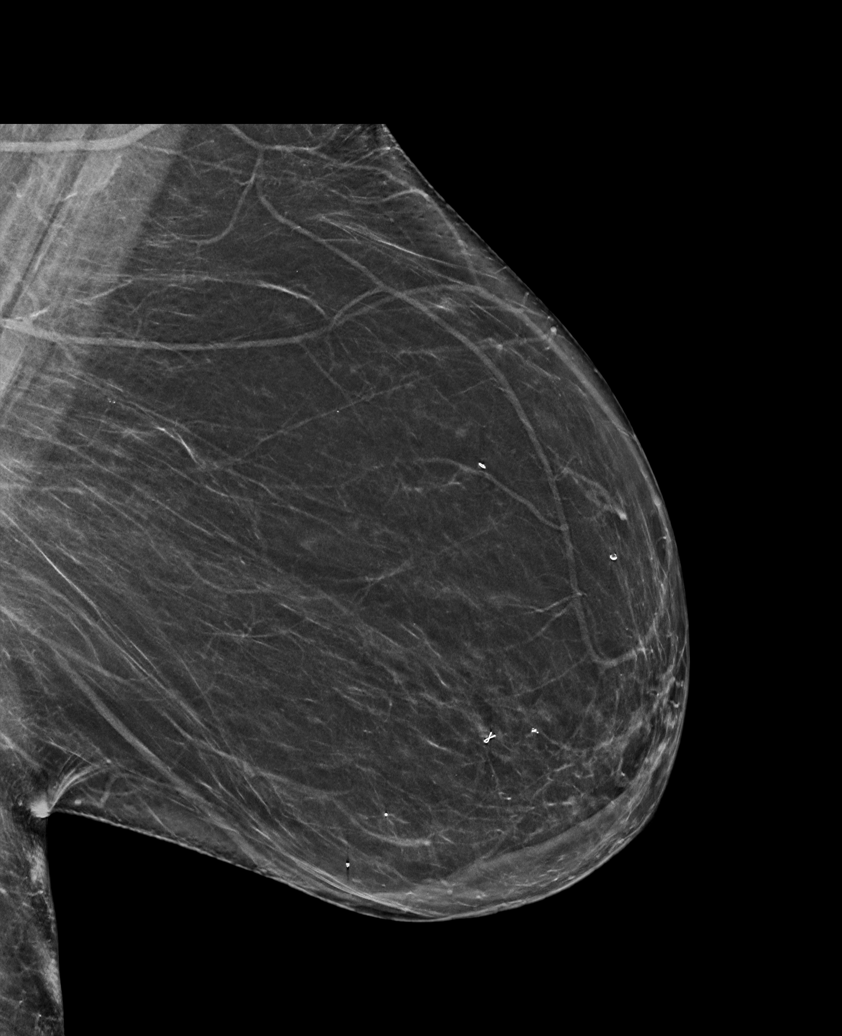

[R CC synth-2D (1 of 2)]
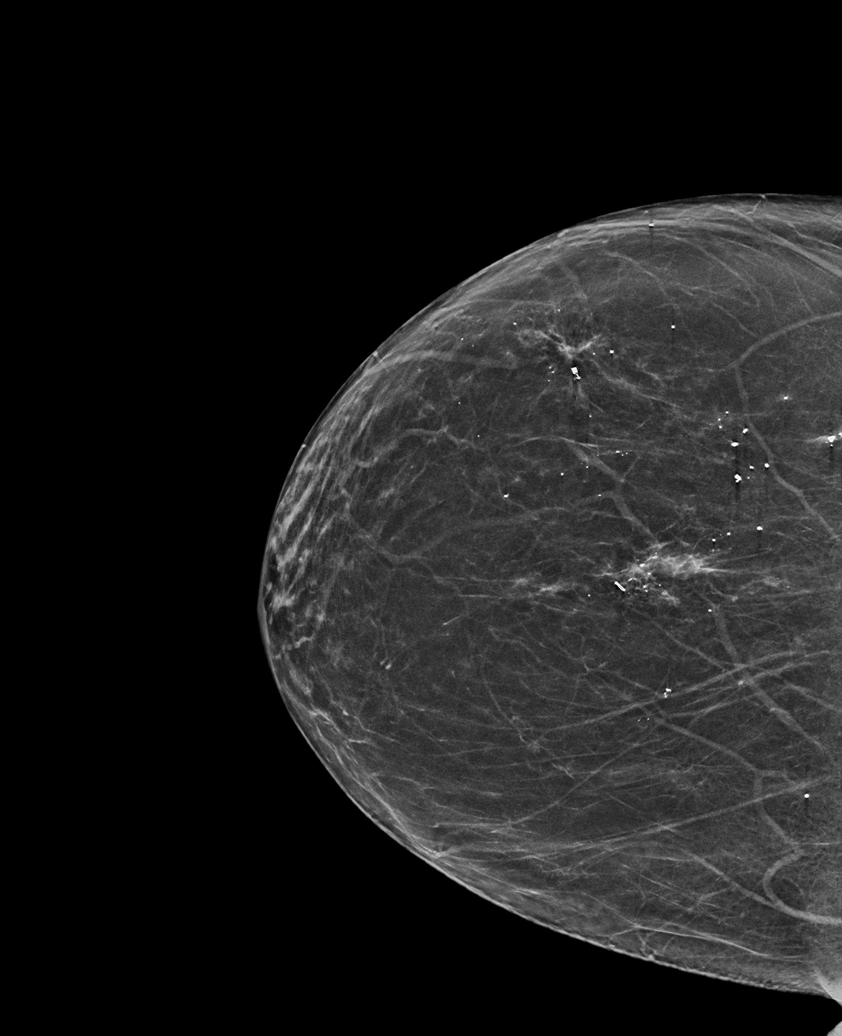

[R MLO synth-2D]
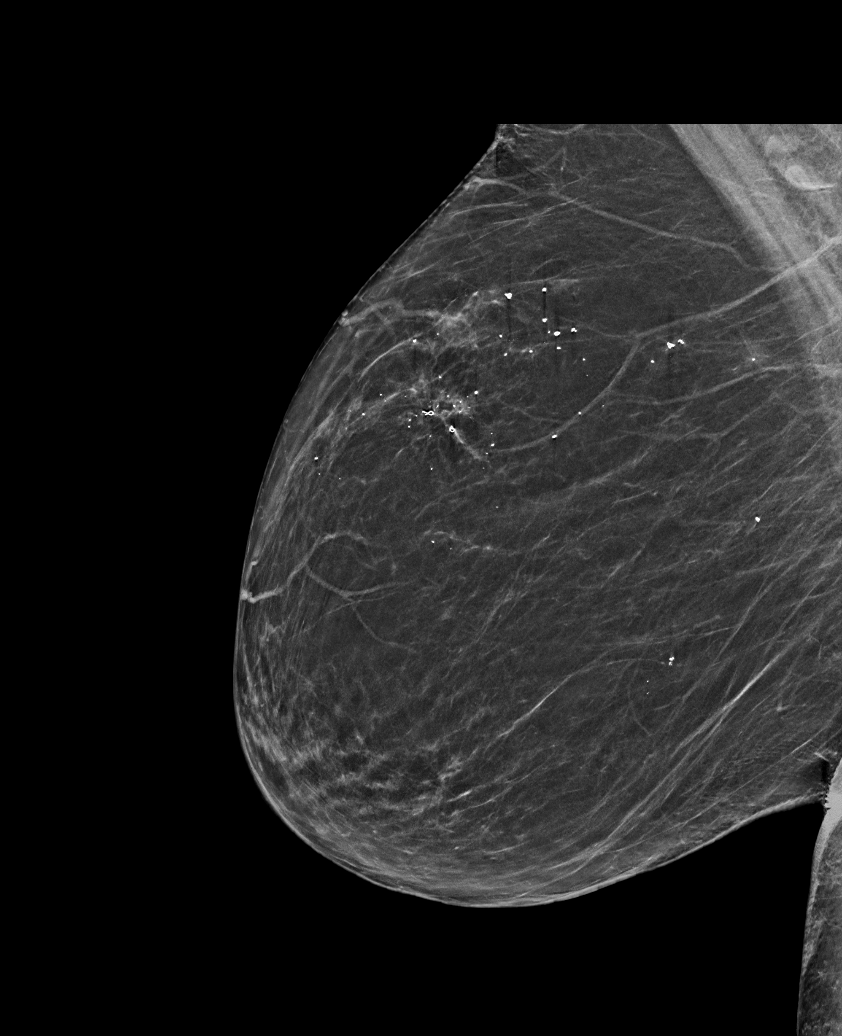

[R CC synth-2D (2 of 2)]
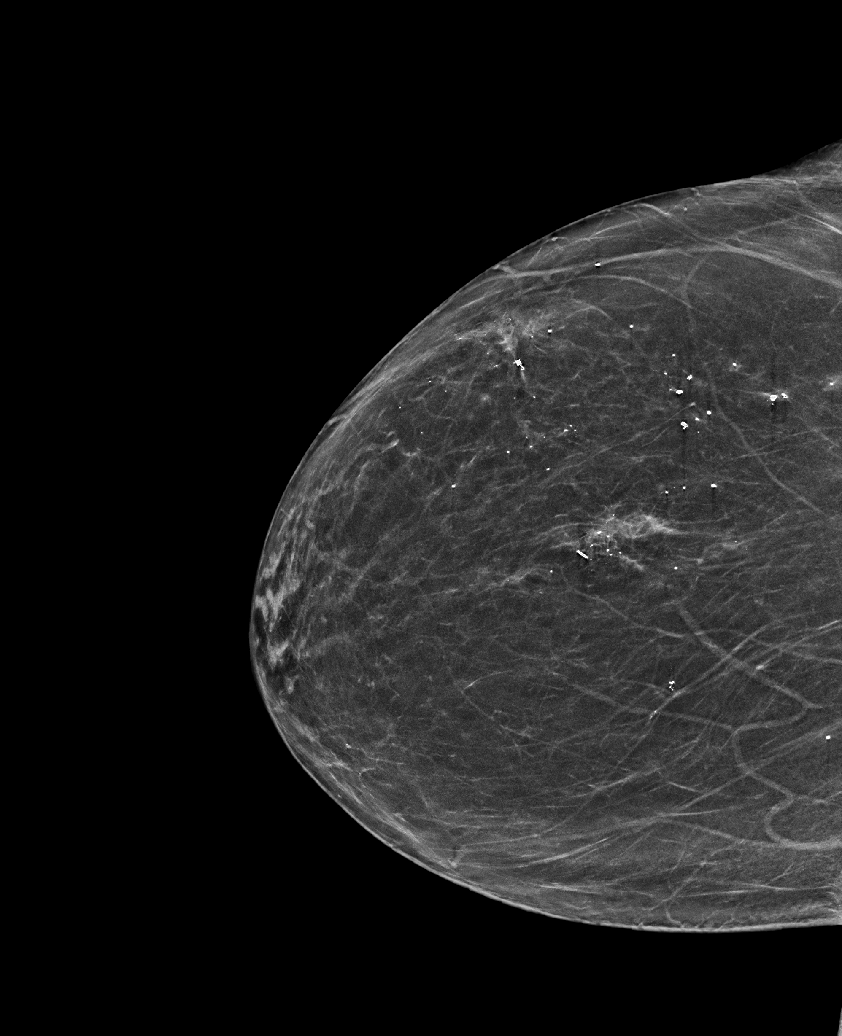

[R CC tomo · tomo slice 31/61.0]
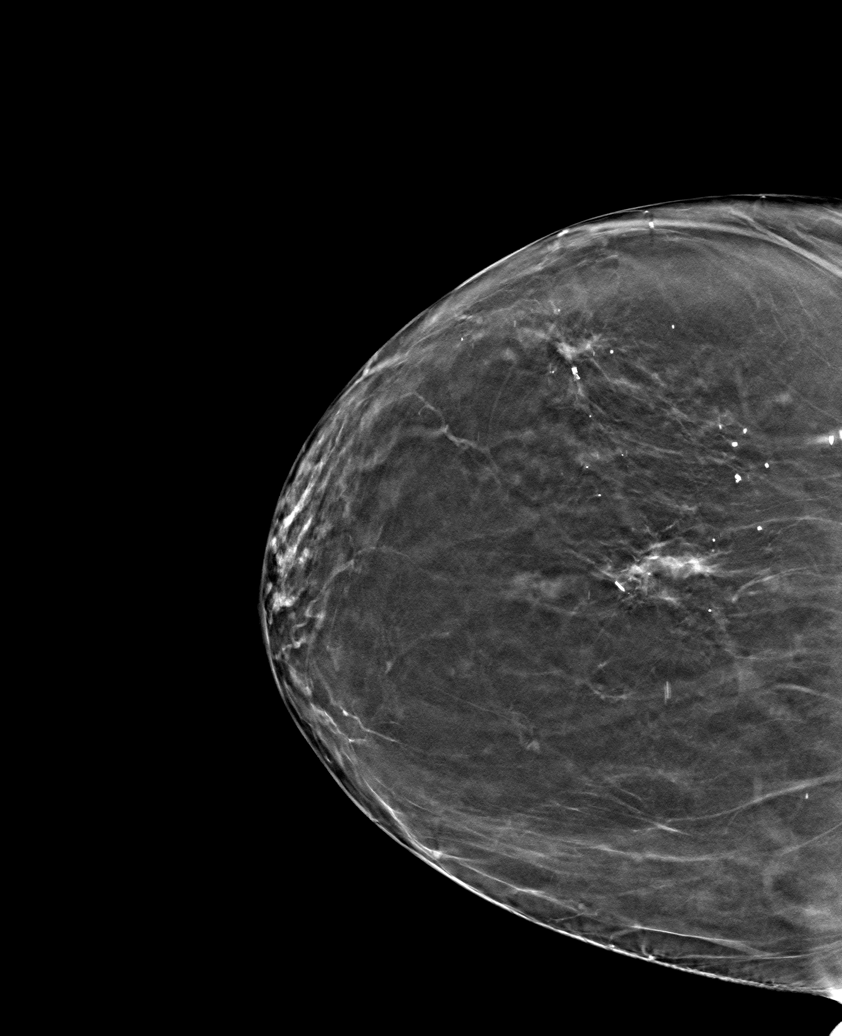

[6 of 30 positions shown; findings below may reference images not displayed]

ACR Breast Density Category b: There are scattered areas of
fibroglandular density.
FINDINGS: There are no findings suspicious for malignancy. Images were
processed with CAD.
IMPRESSION: No mammographic evidence of malignancy. A result letter of this
screening mammogram will be mailed directly to the patient.

RECOMMENDATION:
Screening mammogram in one year. (Code:[id number])

BI-RADS CATEGORY  1: Negative.

## 2021-01-03 DIAGNOSIS — M79672 Pain in left foot: Secondary | ICD-10-CM | POA: Diagnosis not present

## 2021-01-03 DIAGNOSIS — S92322A Displaced fracture of second metatarsal bone, left foot, initial encounter for closed fracture: Secondary | ICD-10-CM | POA: Diagnosis not present

## 2021-01-15 DIAGNOSIS — S92325A Nondisplaced fracture of second metatarsal bone, left foot, initial encounter for closed fracture: Secondary | ICD-10-CM | POA: Diagnosis not present

## 2021-01-15 DIAGNOSIS — M79672 Pain in left foot: Secondary | ICD-10-CM | POA: Diagnosis not present

## 2021-01-30 DIAGNOSIS — N182 Chronic kidney disease, stage 2 (mild): Secondary | ICD-10-CM | POA: Diagnosis not present

## 2021-01-30 DIAGNOSIS — I129 Hypertensive chronic kidney disease with stage 1 through stage 4 chronic kidney disease, or unspecified chronic kidney disease: Secondary | ICD-10-CM | POA: Diagnosis not present

## 2021-01-30 DIAGNOSIS — G720 Drug-induced myopathy: Secondary | ICD-10-CM | POA: Diagnosis not present

## 2021-01-30 DIAGNOSIS — E782 Mixed hyperlipidemia: Secondary | ICD-10-CM | POA: Diagnosis not present

## 2021-02-04 DIAGNOSIS — L723 Sebaceous cyst: Secondary | ICD-10-CM | POA: Diagnosis not present

## 2021-02-04 DIAGNOSIS — L821 Other seborrheic keratosis: Secondary | ICD-10-CM | POA: Diagnosis not present

## 2021-02-04 DIAGNOSIS — L578 Other skin changes due to chronic exposure to nonionizing radiation: Secondary | ICD-10-CM | POA: Diagnosis not present

## 2021-02-04 DIAGNOSIS — Z86018 Personal history of other benign neoplasm: Secondary | ICD-10-CM | POA: Diagnosis not present

## 2021-02-14 DIAGNOSIS — S92325A Nondisplaced fracture of second metatarsal bone, left foot, initial encounter for closed fracture: Secondary | ICD-10-CM | POA: Diagnosis not present

## 2021-02-17 ENCOUNTER — Other Ambulatory Visit: Payer: Self-pay | Admitting: Neurology

## 2021-02-18 ENCOUNTER — Ambulatory Visit: Payer: Medicare Other | Admitting: Internal Medicine

## 2021-02-18 ENCOUNTER — Encounter: Payer: Self-pay | Admitting: Internal Medicine

## 2021-02-18 VITALS — BP 140/86 | HR 80 | Ht 65.5 in | Wt 146.0 lb

## 2021-02-18 DIAGNOSIS — K623 Rectal prolapse: Secondary | ICD-10-CM | POA: Diagnosis not present

## 2021-02-18 DIAGNOSIS — M6289 Other specified disorders of muscle: Secondary | ICD-10-CM | POA: Diagnosis not present

## 2021-02-18 DIAGNOSIS — R159 Full incontinence of feces: Secondary | ICD-10-CM

## 2021-02-18 NOTE — Patient Instructions (Signed)
If you are age 76 or older, your body mass index should be between 23-30. Your Body mass index is 23.93 kg/m. If this is out of the aforementioned range listed, please consider follow up with your Primary Care Provider.  If you are age 45 or younger, your body mass index should be between 19-25. Your Body mass index is 23.93 kg/m. If this is out of the aformentioned range listed, please consider follow up with your Primary Care Provider.   __________________________________________________________  The [facility] providers would like to encourage you to use [facility] to communicate with providers for non-urgent requests or questions.  Due to long hold times on the telephone, sending your provider a message by [facility] may be a faster and more efficient way to get a response.  Please allow 48 business hours for a response.  Please remember that this is for non-urgent requests.   We have sent referral to Dr. Maryland Pink. Their office should contact you with an appointment. If you have not heard from them in 1-2 weeks please contact them at [phone].   Thank you for choosing me and [facility].  Dr. Hilarie Fredrickson

## 2021-02-18 NOTE — Progress Notes (Deleted)
Ctal p

## 2021-02-19 ENCOUNTER — Encounter: Payer: Self-pay | Admitting: Internal Medicine

## 2021-02-19 NOTE — Progress Notes (Signed)
   Subjective:    Patient ID: Rachael Webster, female    DOB: 07/23/1945, 75 y.o.   MRN: [id number]  HPI Rachael Webster is a 75 year old female with a history of internal hemorrhoids treated with hemorrhoidal banding in November, December 2021 and January 2022, history of adenomatous and sessile serrated colon polyps, fecal incontinence who is here for follow-up.  She tolerated the hemorrhoidal banding visits well but reports that she continues to have frequent prolapse of tissue with defecation as well as intermittent fecal incontinence.  This can be spontaneous and she can be unaware of passage of soft stool.  She is not having rectal bleeding.  She is not having abdominal or perianal pain.  She is using Metamucil typically 1-1/2 tablespoons in the morning and 1 tablespoon in the p.m.  She reports this helps with stool consistency and her bowel movements are regular typically 2 times per day.  She reports the Metamucil helps keep her stools "less messy".   Review of Systems As per HPI, otherwise negative  Current Medications, Allergies, Past Medical History, Past Surgical History, Family History and Social History were reviewed in [employer] record.    Objective:   Physical Exam BP 140/86   Pulse 80   Ht 5' 5.5" (1.664 m)   Wt 146 lb (66.2 kg)   BMI 23.93 kg/m  Gen: awake, alert, NAD HEENT: anicteric Abd: soft, NT/ND, +BS throughout Rectal: Normal external exam with no external hemorrhoids, anal sphincter tone is slightly decreased and anal sphincter is rather short, no masses, soft brown stool in the vault ANOSCOPY: Using a disposable, lubricated, slotted, self-illuminating anoscope, the rectum was intubated without difficulty. The trochar was removed and the ano-rectum was circumferentially inspected. There was no evidence of significant residual internal hemorrhoids.  There was no finding of an anorectal fissure. The rectal mucosa was not inflamed.  No neoplasia or other pathology was identified. The inspection was well tolerated.  Ext: no c/c/e Neuro: nonfocal      Assessment & Plan:  75 year old female with a history of internal hemorrhoids treated with hemorrhoidal banding in November, December 2021 and January 2022, history of adenomatous and sessile serrated colon polyps, fecal incontinence who is here for follow-up.  Rectal prolapse/pelvic floor dysfunction/fecal incontinence --we attempted hemorrhoidal banding which eradicated internal hemorrhoids but she continues to have prolapsing rectal tissue, fecal incontinence.  We discussed that this is very likely secondary to pelvic floor dysfunction and quite possibly intermittent rectal prolapse associated with defecation.  We spent time today discussing pelvic floor dysfunction as well as the options for pelvic floor physical therapy as well as surgical opinion.  She is interested in surgical opinion and so we will proceed as follows: --Referral to Dr. Maryland Pink for evaluation of pelvic floor dysfunction, possible rectal prolapse and fecal incontinence --Continue Metamucil twice daily  30 minutes total spent today including patient facing time, coordination of care, reviewing medical history/procedures/pertinent radiology studies, and documentation of the encounter.

## 2021-02-25 ENCOUNTER — Ambulatory Visit: Payer: Medicare Other | Admitting: Internal Medicine

## 2021-02-27 ENCOUNTER — Ambulatory Visit: Payer: Medicare Other | Admitting: Internal Medicine

## 2021-03-03 DIAGNOSIS — G4700 Insomnia, unspecified: Secondary | ICD-10-CM | POA: Diagnosis not present

## 2021-03-03 DIAGNOSIS — F419 Anxiety disorder, unspecified: Secondary | ICD-10-CM | POA: Diagnosis not present

## 2021-04-30 DIAGNOSIS — F419 Anxiety disorder, unspecified: Secondary | ICD-10-CM | POA: Diagnosis not present

## 2021-04-30 DIAGNOSIS — G4700 Insomnia, unspecified: Secondary | ICD-10-CM | POA: Diagnosis not present

## 2021-05-13 DIAGNOSIS — M6289 Other specified disorders of muscle: Secondary | ICD-10-CM | POA: Diagnosis not present

## 2021-05-13 DIAGNOSIS — K623 Rectal prolapse: Secondary | ICD-10-CM | POA: Diagnosis not present

## 2021-05-13 DIAGNOSIS — R8290 Unspecified abnormal findings in urine: Secondary | ICD-10-CM | POA: Diagnosis not present

## 2021-05-13 DIAGNOSIS — R82998 Other abnormal findings in urine: Secondary | ICD-10-CM | POA: Diagnosis not present

## 2021-05-13 DIAGNOSIS — N812 Incomplete uterovaginal prolapse: Secondary | ICD-10-CM | POA: Diagnosis not present

## 2021-05-17 ENCOUNTER — Encounter: Payer: Self-pay | Admitting: Internal Medicine

## 2021-05-23 DIAGNOSIS — R278 Other lack of coordination: Secondary | ICD-10-CM | POA: Diagnosis not present

## 2021-05-23 DIAGNOSIS — M6281 Muscle weakness (generalized): Secondary | ICD-10-CM | POA: Diagnosis not present

## 2021-05-23 DIAGNOSIS — K623 Rectal prolapse: Secondary | ICD-10-CM | POA: Diagnosis not present

## 2021-06-06 DIAGNOSIS — R278 Other lack of coordination: Secondary | ICD-10-CM | POA: Diagnosis not present

## 2021-06-06 DIAGNOSIS — M6281 Muscle weakness (generalized): Secondary | ICD-10-CM | POA: Diagnosis not present

## 2021-06-06 DIAGNOSIS — K623 Rectal prolapse: Secondary | ICD-10-CM | POA: Diagnosis not present

## 2021-06-27 DIAGNOSIS — K623 Rectal prolapse: Secondary | ICD-10-CM | POA: Diagnosis not present

## 2021-06-27 DIAGNOSIS — R278 Other lack of coordination: Secondary | ICD-10-CM | POA: Diagnosis not present

## 2021-06-27 DIAGNOSIS — M6281 Muscle weakness (generalized): Secondary | ICD-10-CM | POA: Diagnosis not present

## 2021-07-01 DIAGNOSIS — N949 Unspecified condition associated with female genital organs and menstrual cycle: Secondary | ICD-10-CM | POA: Diagnosis not present

## 2021-07-01 DIAGNOSIS — R159 Full incontinence of feces: Secondary | ICD-10-CM | POA: Diagnosis not present

## 2021-07-01 DIAGNOSIS — N993 Prolapse of vaginal vault after hysterectomy: Secondary | ICD-10-CM | POA: Diagnosis not present

## 2021-07-01 DIAGNOSIS — K623 Rectal prolapse: Secondary | ICD-10-CM | POA: Diagnosis not present

## 2021-07-11 DIAGNOSIS — K623 Rectal prolapse: Secondary | ICD-10-CM | POA: Diagnosis not present

## 2021-07-11 DIAGNOSIS — R278 Other lack of coordination: Secondary | ICD-10-CM | POA: Diagnosis not present

## 2021-07-11 DIAGNOSIS — M6281 Muscle weakness (generalized): Secondary | ICD-10-CM | POA: Diagnosis not present

## 2021-07-25 DIAGNOSIS — K623 Rectal prolapse: Secondary | ICD-10-CM | POA: Diagnosis not present

## 2021-07-25 DIAGNOSIS — M6281 Muscle weakness (generalized): Secondary | ICD-10-CM | POA: Diagnosis not present

## 2021-07-25 DIAGNOSIS — R278 Other lack of coordination: Secondary | ICD-10-CM | POA: Diagnosis not present

## 2021-08-01 DIAGNOSIS — K623 Rectal prolapse: Secondary | ICD-10-CM | POA: Diagnosis not present

## 2021-08-01 DIAGNOSIS — M6281 Muscle weakness (generalized): Secondary | ICD-10-CM | POA: Diagnosis not present

## 2021-08-01 DIAGNOSIS — R278 Other lack of coordination: Secondary | ICD-10-CM | POA: Diagnosis not present

## 2021-08-08 DIAGNOSIS — K623 Rectal prolapse: Secondary | ICD-10-CM | POA: Diagnosis not present

## 2021-08-08 DIAGNOSIS — R278 Other lack of coordination: Secondary | ICD-10-CM | POA: Diagnosis not present

## 2021-08-08 DIAGNOSIS — M6281 Muscle weakness (generalized): Secondary | ICD-10-CM | POA: Diagnosis not present

## 2021-08-15 DIAGNOSIS — M6281 Muscle weakness (generalized): Secondary | ICD-10-CM | POA: Diagnosis not present

## 2021-08-15 DIAGNOSIS — K623 Rectal prolapse: Secondary | ICD-10-CM | POA: Diagnosis not present

## 2021-08-15 DIAGNOSIS — R278 Other lack of coordination: Secondary | ICD-10-CM | POA: Diagnosis not present

## 2021-08-19 DIAGNOSIS — M810 Age-related osteoporosis without current pathological fracture: Secondary | ICD-10-CM | POA: Diagnosis not present

## 2021-08-19 DIAGNOSIS — E782 Mixed hyperlipidemia: Secondary | ICD-10-CM | POA: Diagnosis not present

## 2021-08-19 DIAGNOSIS — Z0000 Encounter for general adult medical examination without abnormal findings: Secondary | ICD-10-CM | POA: Diagnosis not present

## 2021-08-19 DIAGNOSIS — N182 Chronic kidney disease, stage 2 (mild): Secondary | ICD-10-CM | POA: Diagnosis not present

## 2021-08-19 DIAGNOSIS — I129 Hypertensive chronic kidney disease with stage 1 through stage 4 chronic kidney disease, or unspecified chronic kidney disease: Secondary | ICD-10-CM | POA: Diagnosis not present

## 2021-08-19 DIAGNOSIS — K219 Gastro-esophageal reflux disease without esophagitis: Secondary | ICD-10-CM | POA: Diagnosis not present

## 2021-09-02 DIAGNOSIS — M6281 Muscle weakness (generalized): Secondary | ICD-10-CM | POA: Diagnosis not present

## 2021-09-02 DIAGNOSIS — K623 Rectal prolapse: Secondary | ICD-10-CM | POA: Diagnosis not present

## 2021-09-02 DIAGNOSIS — R278 Other lack of coordination: Secondary | ICD-10-CM | POA: Diagnosis not present

## 2021-09-09 DIAGNOSIS — K623 Rectal prolapse: Secondary | ICD-10-CM | POA: Diagnosis not present

## 2021-09-12 DIAGNOSIS — R278 Other lack of coordination: Secondary | ICD-10-CM | POA: Diagnosis not present

## 2021-09-12 DIAGNOSIS — M6281 Muscle weakness (generalized): Secondary | ICD-10-CM | POA: Diagnosis not present

## 2021-09-12 DIAGNOSIS — K623 Rectal prolapse: Secondary | ICD-10-CM | POA: Diagnosis not present

## 2021-09-18 DIAGNOSIS — S39012A Strain of muscle, fascia and tendon of lower back, initial encounter: Secondary | ICD-10-CM | POA: Diagnosis not present

## 2021-09-18 DIAGNOSIS — M545 Low back pain, unspecified: Secondary | ICD-10-CM | POA: Diagnosis not present

## 2021-09-26 DIAGNOSIS — R278 Other lack of coordination: Secondary | ICD-10-CM | POA: Diagnosis not present

## 2021-09-26 DIAGNOSIS — M6281 Muscle weakness (generalized): Secondary | ICD-10-CM | POA: Diagnosis not present

## 2021-09-26 DIAGNOSIS — K623 Rectal prolapse: Secondary | ICD-10-CM | POA: Diagnosis not present

## 2021-10-01 ENCOUNTER — Encounter: Payer: Self-pay | Admitting: Adult Health

## 2021-10-01 MED ORDER — LAMOTRIGINE 100 MG PO TBDP: 100.0000 mg | ORAL_TABLET | Freq: Two times a day (BID) | ORAL | 4 refills | Status: DC

## 2021-10-02 DIAGNOSIS — M545 Low back pain, unspecified: Secondary | ICD-10-CM | POA: Diagnosis not present

## 2021-10-09 MED ORDER — LAMOTRIGINE 100 MG PO TABS: 100.0000 mg | ORAL_TABLET | Freq: Two times a day (BID) | ORAL | 0 refills | Status: DC

## 2021-10-09 NOTE — Addendum Note (Signed)
Addended by: Gildardo Griffes on: 10/09/2021 05:04 PM ? ? Modules accepted: Orders ? ?

## 2021-10-09 NOTE — Telephone Encounter (Signed)
Spoke with [facility]. They had the prescription. They did ask to clarify if we wanted dissolvable tablets vs swallow. I advised to keep the same as before which has been dissolvable. They will process RX and send to patient. Patient has been updated via [employer].  ?

## 2021-10-09 NOTE — Telephone Encounter (Signed)
Lamotrigine 100 mg dissolvable tablet canceled and PO tablet ordered, 100 mg BID #180. Sent to [facility] mail order pharmacy.  ?

## 2021-10-09 NOTE — Telephone Encounter (Signed)
Okay to change Rx for lamictal as requested. ?

## 2021-10-28 DIAGNOSIS — M5451 Vertebrogenic low back pain: Secondary | ICD-10-CM | POA: Diagnosis not present

## 2021-10-28 DIAGNOSIS — M5416 Radiculopathy, lumbar region: Secondary | ICD-10-CM | POA: Diagnosis not present

## 2021-10-29 ENCOUNTER — Ambulatory Visit: Payer: Self-pay | Admitting: Orthopedic Surgery

## 2021-11-03 NOTE — Pre-Procedure Instructions (Signed)
Surgical Instructions ? ? ? Your procedure is scheduled on Thursday, May 18th. ? Report to [facility] Main Entrance "A" at 10:40 A.M., then check in with the Admitting office. ? Call this number if you have problems the morning of surgery: ? [phone] ? ? If you have any questions prior to your surgery date call [phone]: Open Monday-Friday 8am-4pm ? ? ? Remember: ? Do not eat or drink after midnight the night before your surgery ?  ? Take these medicines the morning of surgery with A SIP OF WATER  ?amLODipine (NORVASC)  ?fluvastatin XL (LESCOL XL) ?gabapentin (NEURONTIN) ?lamoTRIgine (LAMICTAL) ?omeprazole (PRILOSEC)  ?metaxalone [facility])  ? ?As needed: ?clonazePAM [facility] [place]) ?oxyCODONE (OXY IR/ROXICODONE)  ?venlafaxine XR (EFFEXOR-XR) ? ?As of today, STOP taking any Aspirin (unless otherwise instructed by your surgeon) Aleve, Naproxen, Ibuprofen, Motrin, Advil, Goody's, BC's, all herbal medications, fish oil, and all vitamins. ?         ?           ?Do NOT Smoke (Tobacco/Vaping) for 24 hours prior to your procedure. ? ?If you use a CPAP at night, you may bring your mask/headgear for your overnight stay. ?  ?Contacts, glasses, piercing's, hearing aid's, dentures or partials may not be worn into surgery, please bring cases for these belongings.  ?  ?For patients admitted to the hospital, discharge time will be determined by your treatment team. ?  ?Patients discharged the day of surgery will not be allowed to drive home, and someone needs to stay with them for 24 hours. ? ?SURGICAL WAITING ROOM VISITATION ?Patients having surgery or a procedure may have two support people in the waiting room. These visitors may be switched out with other visitors if needed. ?Children under the age of 53 must have an adult accompany them who is not the patient. ?If the patient needs to stay at the hospital during part of their recovery, the visitor guidelines for inpatient rooms apply. ? ?Please refer to the [facility]  website for the visitor guidelines for Inpatients (after your surgery is over and you are in a regular room).  ? ? ?Special instructions:   ?[facility]- Preparing For Surgery ? ?Before surgery, you can play an important role. Because skin is not sterile, your skin needs to be as free of germs as possible. You can reduce the number of germs on your skin by washing with CHG (chlorahexidine gluconate) Soap before surgery.  CHG is an antiseptic cleaner which kills germs and bonds with the skin to continue killing germs even after washing.   ? ?Oral Hygiene is also important to reduce your risk of infection.  Remember - BRUSH YOUR TEETH THE MORNING OF SURGERY WITH YOUR REGULAR TOOTHPASTE ? ?Please do not use if you have an allergy to CHG or antibacterial soaps. If your skin becomes reddened/irritated stop using the CHG.  ?Do not shave (including legs and underarms) for at least 48 hours prior to first CHG shower. It is OK to shave your face. ? ?Please follow these instructions carefully. ?  ?Shower the NIGHT BEFORE SURGERY and the MORNING OF SURGERY ? ?If you chose to wash your hair, wash your hair first as usual with your normal shampoo. ? ?After you shampoo, rinse your hair and body thoroughly to remove the shampoo. ? ?Use CHG Soap as you would any other liquid soap. You can apply CHG directly to the skin and wash gently with a scrungie or a clean washcloth.  ? ?Apply the CHG Soap  to your body ONLY FROM THE NECK DOWN.  Do not use on open wounds or open sores. Avoid contact with your eyes, ears, mouth and genitals (private parts). Wash Face and genitals (private parts)  with your normal soap.  ? ?Wash thoroughly, paying special attention to the area where your surgery will be performed. ? ?Thoroughly rinse your body with warm water from the neck down. ? ?DO NOT shower/wash with your normal soap after using and rinsing off the CHG Soap. ? ?Pat yourself dry with a CLEAN TOWEL. ? ?Wear CLEAN PAJAMAS to bed the night  before surgery ? ?Place CLEAN SHEETS on your bed the night before your surgery ? ?DO NOT SLEEP WITH PETS. ? ? ?Day of Surgery: ?Take a shower with CHG soap. ?Do not wear jewelry or makeup ?Do not wear lotions, powders, perfumes, or deodorant. ?Do not shave 48 hours prior to surgery.  ?Do not bring valuables to the hospital.  ?[facility] is not responsible for any belongings or valuables. ?Do not wear nail polish, gel polish, artificial nails, or any other type of covering on natural nails (fingers and toes) ?If you have artificial nails or gel coating that need to be removed by a nail salon, please have this removed prior to surgery. Artificial nails or gel coating may interfere with anesthesia's ability to adequately monitor your vital signs. ?Wear Clean/Comfortable clothing the morning of surgery ?Remember to brush your teeth WITH YOUR REGULAR TOOTHPASTE. ?  ?Please read over the following fact sheets that you were given. ? ? ? ?If you received a COVID test during your pre-op visit  it is requested that you wear a mask when out in public, stay away from anyone that may not be feeling well and notify your surgeon if you develop symptoms. If you have been in contact with anyone that has tested positive in the last 10 days please notify you surgeon. ? ?

## 2021-11-04 ENCOUNTER — Encounter (HOSPITAL_COMMUNITY)
Admission: RE | Admit: 2021-11-04 | Discharge: 2021-11-04 | Disposition: A | Discharge status: Home / Self Care | Payer: Medicare Other | Source: Ambulatory Visit | Attending: Orthopedic Surgery | Admitting: Orthopedic Surgery

## 2021-11-04 ENCOUNTER — Encounter (HOSPITAL_COMMUNITY): Payer: Self-pay

## 2021-11-04 ENCOUNTER — Other Ambulatory Visit: Payer: Self-pay

## 2021-11-04 VITALS — BP 139/75 | HR 77 | Temp 97.5°F | Resp 17 | Ht 65.0 in | Wt 140.0 lb

## 2021-11-04 DIAGNOSIS — Z01818 Encounter for other preprocedural examination: Secondary | ICD-10-CM | POA: Diagnosis not present

## 2021-11-04 DIAGNOSIS — M48061 Spinal stenosis, lumbar region without neurogenic claudication: Secondary | ICD-10-CM | POA: Insufficient documentation

## 2021-11-04 DIAGNOSIS — Z9049 Acquired absence of other specified parts of digestive tract: Secondary | ICD-10-CM | POA: Insufficient documentation

## 2021-11-04 DIAGNOSIS — Z8249 Family history of ischemic heart disease and other diseases of the circulatory system: Secondary | ICD-10-CM | POA: Diagnosis not present

## 2021-11-04 DIAGNOSIS — R519 Headache, unspecified: Secondary | ICD-10-CM | POA: Diagnosis not present

## 2021-11-04 DIAGNOSIS — Z8379 Family history of other diseases of the digestive system: Secondary | ICD-10-CM | POA: Insufficient documentation

## 2021-11-04 DIAGNOSIS — Z8349 Family history of other endocrine, nutritional and metabolic diseases: Secondary | ICD-10-CM | POA: Diagnosis not present

## 2021-11-04 DIAGNOSIS — I2510 Atherosclerotic heart disease of native coronary artery without angina pectoris: Secondary | ICD-10-CM | POA: Insufficient documentation

## 2021-11-04 LAB — CBC
HCT: 41.5 % (ref 36.0–46.0)
Hemoglobin: 14.2 g/dL (ref 12.0–15.0)
MCH: 34.1 pg — ABNORMAL HIGH (ref 26.0–34.0)
MCHC: 34.2 g/dL (ref 30.0–36.0)
MCV: 99.8 fL (ref 80.0–100.0)
Platelets: 276 10*3/uL (ref 150–400)
RBC: 4.16 MIL/uL (ref 3.87–5.11)
RDW: 12.9 % (ref 11.5–15.5)
WBC: 9.5 10*3/uL (ref 4.0–10.5)
nRBC: 0 % (ref 0.0–0.2)

## 2021-11-04 LAB — BASIC METABOLIC PANEL
Anion gap: 6 (ref 5–15)
BUN: 16 mg/dL (ref 8–23)
CO2: 27 mmol/L (ref 22–32)
Calcium: 10.5 mg/dL — ABNORMAL HIGH (ref 8.9–10.3)
Chloride: 109 mmol/L (ref 98–111)
Creatinine, Ser: 0.78 mg/dL (ref 0.44–1.00)
GFR, Estimated: >60 mL/min (ref >60)
Glucose, Bld: 113 mg/dL — ABNORMAL HIGH (ref 70–99)
Potassium: 3.6 mmol/L (ref 3.5–5.1)
Sodium: 142 mmol/L (ref 135–145)

## 2021-11-04 LAB — SURGICAL PCR SCREEN
MRSA, PCR: NEGATIVE
Staphylococcus aureus: NEGATIVE

## 2021-11-04 NOTE — Progress Notes (Signed)
PCP - Dr. Kelton Pillar ?Cardiologist - denies ? ?PPM/ICD - n/a ? ?Chest x-ray -n/a  ?EKG - 11/04/21 ?Stress Test - 15+ years ago. Reported normal ?ECHO - denies ?Cardiac Cath - denies ? ?Sleep Study - denies ?CPAP - denies ? ?Blood Thinner Instructions: n/a ?Aspirin Instructions: n/a ? ?NPO at MD  ? ?COVID TEST- n/a ? ? ?Anesthesia review: Yes, clearance received. ? ?Patient denies shortness of breath, fever, cough and chest pain at PAT appointment ? ? ?All instructions explained to the patient, with a verbal understanding of the material. Patient agrees to go over the instructions while at home for a better understanding. Patient also instructed to self quarantine after being tested for COVID-19. The opportunity to ask questions was provided. ? ? ?

## 2021-11-05 ENCOUNTER — Encounter (HOSPITAL_COMMUNITY): Payer: Self-pay

## 2021-11-05 DIAGNOSIS — M5451 Vertebrogenic low back pain: Secondary | ICD-10-CM | POA: Diagnosis not present

## 2021-11-05 NOTE — Anesthesia Preprocedure Evaluation (Addendum)
Anesthesia Evaluation  Patient identified by MRN, date of birth, ID band Patient awake    Reviewed: Allergy & Precautions, NPO status , Patient's Chart, lab work & pertinent test results  History of Anesthesia Complications (+) PONV and history of anesthetic complications  Airway Mallampati: I  TM Distance: <3 FB Neck ROM: Full    Dental  (+) Teeth Intact, Dental Advisory Given   Pulmonary neg pulmonary ROS,    Pulmonary exam normal        Cardiovascular hypertension, Pt. on medications  Rhythm:Regular Rate:Normal     Neuro/Psych  Headaches, PSYCHIATRIC DISORDERS Depression    GI/Hepatic Neg liver ROS, GERD  Medicated,  Endo/Other  negative endocrine ROS  Renal/GU negative Renal ROS     Musculoskeletal negative musculoskeletal ROS (+)   Abdominal Normal abdominal exam  (+)   Peds  Hematology negative hematology ROS (+)   Anesthesia Other Findings   Reproductive/Obstetrics                           Anesthesia Physical Anesthesia Plan  ASA: 2  Anesthesia Plan: General   Post-op Pain Management:    Induction: Intravenous  PONV Risk Score and Plan: 4 or greater and Ondansetron, Dexamethasone and Treatment may vary due to age or medical condition  Airway Management Planned: Oral ETT  Additional Equipment: None  Intra-op Plan:   Post-operative Plan: Extubation in OR  Informed Consent: I have reviewed the patients History and Physical, chart, labs and discussed the procedure including the risks, benefits and alternatives for the proposed anesthesia with the patient or authorized representative who has indicated his/her understanding and acceptance.     Dental advisory given  Plan Discussed with: CRNA  Anesthesia Plan Comments: (PAT note written 11/05/2021 by Myra Gianotti, PA-C. )      Anesthesia Quick Evaluation

## 2021-11-05 NOTE — Progress Notes (Signed)
Anesthesia Chart Review: ? Case: [id number] Date/Time: 11/06/21 1225  ? Procedure: L4-5 Decompression with discectomy - 3 C-Bed  ? Anesthesia type: General  ? Pre-op diagnosis: L4-5 Hernatied disc with stenosis and radiculopathy  ? Location: [facility] ROOM 04 / [facility]  ? Surgeons: Melina Schools, MD  ? ?  ? ? ?DISCUSSION: Patient is a 75 year old female scheduled for the above procedure. ? ?History includes never smoker, post-operative N/V, HTN, HLD, GERD, chronic daily headache, hearing aids, perforated duodenal ulcer (s/p exploratory laparotomy, Graham patch closure 08/24/99), cholecystectomy (11/08/08).  ? ?PCP Kelton Pillar, MD saw of 08/19/21. She signed a note of clearance for surgery. She denied SOB, CP at PAT RN visit.  ? ?Anesthesia team to evaluate on the day of surgery. ? ? ?VS: BP 139/75   Pulse 77   Temp (!) 36.4 ?C   Resp 17   Ht '5\' 5"'$  (1.651 m)   Wt 63.5 kg   SpO2 97%   BMI 23.30 kg/m?  ? ? ?PROVIDERS: ?Kelton Pillar, MD is PCP  ?Sarina Ill, MD is neurologist ?Scarlette Shorts, MD is GI ? ? ?LABS: Labs reviewed: Acceptable for surgery. Calcium 10.5, down from 10.9 with labs from [facility] on 08/19/21. ?(all labs ordered are listed, but only abnormal results are displayed) ? ?Labs Reviewed  ?BASIC METABOLIC PANEL - Abnormal; Notable for the following components:  ?    Result Value  ? Glucose, Bld 113 (*)   ? Calcium 10.5 (*)   ? All other components within normal limits  ?CBC - Abnormal; Notable for the following components:  ? MCH 34.1 (*)   ? All other components within normal limits  ?SURGICAL PCR SCREEN  ? ? ?EKG: 11/04/21: ?Normal sinus rhythm ?Possible Left atrial enlargement ?Cannot rule out Inferior infarct , age undetermined ?Abnormal ECG ?No previous ECGs available ?No previous tracing ?Confirmed by Lyman Bishop [phone]) on 11/04/2021 9:29:11 PM ? ? ?CV: ?CT Coronary Calcium Score 02/26/05 (report in [facility]/PACS): ?IMPRESSION: ?1. Right dominant system with large RCA. In the mid RCA there is  a very small noncalcified, non-obstructing plaque. ?2. Calcium score of zero. ?3. Normal left ventricular motion with 74 percent left ventricular ejection fraction. ?4. Trace pericardial fluid.  ? ?She reported a normal stress test > 15 years ago. ? ? ?Past Medical History:  ?Diagnosis Date  ? Abdominal pain   ? middle of abd, 1 time only  ? Adenomatous colon polyp   ? Depression   ? Fecal incontinence   ? metamucill helps  ? FH: stomach ulcer   ? 2001/ per pt, personal hx of ulcer, no family  ? GERD (gastroesophageal reflux disease)   ? Headache, chronic daily   ? Hearing loss   ? Wears Bil hearing aids.  ? Hyperlipidemia   ? Hypertension   ? Internal hemorrhoids   ? Osteopenia   ? Post-operative nausea and vomiting 1986  ? only 1 time  ? ? ?Past Surgical History:  ?Procedure Laterality Date  ? ABDOMINAL HYSTERECTOMY  1989  ? BREAST BIOPSY Left 2016  ? BREAST BIOPSY Right 01/06/2018  ? x2  ? BREAST EXCISIONAL BIOPSY Right   ? CHOLECYSTECTOMY  2007  ? EXPLORATORY LAPAROTOMY  08/24/1999  ? exploratory laparotomy with Phillip Heal patch closoure of perforated duodenal ulcer  ? [place]  ? right  ? TONSILLECTOMY  1956  ? TUBAL LIGATION  1976  ? WRIST SURGERY  11/26/2016  ? right wrist  ? ? ?MEDICATIONS: ?  amLODipine (NORVASC) 10 MG tablet  ? Calcium Carbonate-Vitamin D (CALCIUM 600/VITAMIN D PO)  ? Cholecalciferol (VITAMIN D) 2000 UNITS CAPS  ? clonazePAM (KLONOPIN) 0.5 MG tablet  ? estradiol (ESTRACE) 0.1 MG/GM vaginal cream  ? ezetimibe (ZETIA) 10 MG tablet  ? fluvastatin XL (LESCOL XL) 80 MG 24 hr tablet  ? gabapentin (NEURONTIN) 100 MG capsule  ? gabapentin (NEURONTIN) 300 MG capsule  ? lamoTRIgine (LAMICTAL) 100 MG tablet  ? metaxalone (SKELAXIN) 800 MG tablet  ? Multiple Vitamins-Minerals (CENTRUM SILVER ADULT 50+ PO)  ? Omega-3 Fatty Acids (FISH OIL) 1200 MG CAPS  ? omeprazole (PRILOSEC) 20 MG capsule  ? oxyCODONE (OXY IR/ROXICODONE) 5 MG immediate release tablet  ? TURMERIC CURCUMIN PO  ? venlafaxine  XR (EFFEXOR-XR) 75 MG 24 hr capsule  ? ?No current facility-administered medications for this encounter.  ? ? ?Myra Gianotti, PA-C ?Surgical Short Stay/Anesthesiology ?[facility] Phone [phone] ?[facility] Phone [phone] ?11/05/2021 10:32 AM ? ? ? ? ? ? ? ?

## 2021-11-06 ENCOUNTER — Ambulatory Visit (HOSPITAL_BASED_OUTPATIENT_CLINIC_OR_DEPARTMENT_OTHER): Payer: Medicare Other | Admitting: Certified Registered"

## 2021-11-06 ENCOUNTER — Encounter (HOSPITAL_COMMUNITY)
Admission: RE | Disposition: A | Discharge status: Home / Self Care | Payer: Self-pay | Source: Home / Self Care | Attending: Orthopedic Surgery

## 2021-11-06 ENCOUNTER — Ambulatory Visit (HOSPITAL_COMMUNITY): Payer: Medicare Other

## 2021-11-06 ENCOUNTER — Ambulatory Visit (HOSPITAL_COMMUNITY): Payer: Medicare Other | Admitting: Vascular Surgery

## 2021-11-06 ENCOUNTER — Encounter (HOSPITAL_COMMUNITY): Payer: Self-pay | Admitting: Orthopedic Surgery

## 2021-11-06 ENCOUNTER — Other Ambulatory Visit: Payer: Self-pay

## 2021-11-06 ENCOUNTER — Observation Stay (HOSPITAL_COMMUNITY)
Admission: RE | Admit: 2021-11-06 | Discharge: 2021-11-07 | Disposition: A | Discharge status: Home / Self Care | Payer: Medicare Other | Attending: Orthopedic Surgery | Admitting: Orthopedic Surgery

## 2021-11-06 DIAGNOSIS — Z79899 Other long term (current) drug therapy: Secondary | ICD-10-CM | POA: Insufficient documentation

## 2021-11-06 DIAGNOSIS — M5116 Intervertebral disc disorders with radiculopathy, lumbar region: Secondary | ICD-10-CM | POA: Diagnosis not present

## 2021-11-06 DIAGNOSIS — M48061 Spinal stenosis, lumbar region without neurogenic claudication: Secondary | ICD-10-CM

## 2021-11-06 DIAGNOSIS — Z01818 Encounter for other preprocedural examination: Secondary | ICD-10-CM

## 2021-11-06 DIAGNOSIS — I10 Essential (primary) hypertension: Secondary | ICD-10-CM | POA: Insufficient documentation

## 2021-11-06 DIAGNOSIS — M5126 Other intervertebral disc displacement, lumbar region: Secondary | ICD-10-CM | POA: Diagnosis present

## 2021-11-06 HISTORY — PX: LUMBAR LAMINECTOMY/DECOMPRESSION MICRODISCECTOMY: SHX5026

## 2021-11-06 SURGERY — LUMBAR LAMINECTOMY/DECOMPRESSION MICRODISCECTOMY 1 LEVEL
Anesthesia: General | Site: Spine Lumbar

## 2021-11-06 MED ORDER — PROPOFOL 10 MG/ML IV BOLUS
INTRAVENOUS | Status: AC
  Filled 2021-11-06: qty 20

## 2021-11-06 MED ORDER — SODIUM CHLORIDE 0.9% FLUSH: 3.0000 mL | Freq: Two times a day (BID) | INTRAVENOUS | Status: DC

## 2021-11-06 MED ORDER — PROPOFOL 10 MG/ML IV BOLUS
INTRAVENOUS | Status: DC | PRN
  Administered 2021-11-06: 150 mg via INTRAVENOUS

## 2021-11-06 MED ORDER — METHYLPREDNISOLONE ACETATE 40 MG/ML IJ SUSP
INTRAMUSCULAR | Status: DC | PRN
  Administered 2021-11-06: 40 mg

## 2021-11-06 MED ORDER — LIDOCAINE 2% (20 MG/ML) 5 ML SYRINGE
INTRAMUSCULAR | Status: DC | PRN
  Administered 2021-11-06: 40 mg via INTRAVENOUS

## 2021-11-06 MED ORDER — SURGIFLO WITH THROMBIN (HEMOSTATIC MATRIX KIT) OPTIME
TOPICAL | Status: DC | PRN
  Administered 2021-11-06: 1 via TOPICAL

## 2021-11-06 MED ORDER — METHOCARBAMOL 500 MG PO TABS: 500.0000 mg | ORAL_TABLET | Freq: Three times a day (TID) | ORAL | 0 refills | Status: AC | PRN

## 2021-11-06 MED ORDER — BUPIVACAINE-EPINEPHRINE 0.25% -1:200000 IJ SOLN
INTRAMUSCULAR | Status: DC | PRN
  Administered 2021-11-06: 10 mL

## 2021-11-06 MED ORDER — FLEET ENEMA 7-19 GM/118ML RE ENEM: 1.0000 | ENEMA | Freq: Once | RECTAL | Status: DC | PRN

## 2021-11-06 MED ORDER — DEXAMETHASONE SODIUM PHOSPHATE 10 MG/ML IJ SOLN
INTRAMUSCULAR | Status: AC
  Filled 2021-11-06: qty 1

## 2021-11-06 MED ORDER — OXYCODONE HCL 5 MG PO TABS
10.0000 mg | ORAL_TABLET | ORAL | Status: DC | PRN
  Administered 2021-11-06: 10 mg via ORAL
  Filled 2021-11-06: qty 2

## 2021-11-06 MED ORDER — MIDAZOLAM HCL 2 MG/2ML IJ SOLN
INTRAMUSCULAR | Status: DC | PRN
  Administered 2021-11-06: 2 mg via INTRAVENOUS

## 2021-11-06 MED ORDER — METHOCARBAMOL 1000 MG/10ML IJ SOLN: 500.0000 mg | Freq: Four times a day (QID) | INTRAVENOUS | Status: DC | PRN

## 2021-11-06 MED ORDER — CEFAZOLIN SODIUM-DEXTROSE 2-4 GM/100ML-% IV SOLN
2.0000 g | INTRAVENOUS | Status: AC
  Administered 2021-11-06: 2 g via INTRAVENOUS

## 2021-11-06 MED ORDER — ACETAMINOPHEN 650 MG RE SUPP: 650.0000 mg | RECTAL | Status: DC | PRN

## 2021-11-06 MED ORDER — ACETAMINOPHEN 10 MG/ML IV SOLN: 1000.0000 mg | Freq: Once | INTRAVENOUS | Status: DC | PRN

## 2021-11-06 MED ORDER — ONDANSETRON HCL 4 MG PO TABS: 4.0000 mg | ORAL_TABLET | Freq: Four times a day (QID) | ORAL | Status: DC | PRN

## 2021-11-06 MED ORDER — THROMBIN 20000 UNITS EX SOLR
CUTANEOUS | Status: DC | PRN
  Administered 2021-11-06: 20 mL via TOPICAL

## 2021-11-06 MED ORDER — VENLAFAXINE HCL ER 75 MG PO CP24
225.0000 mg | ORAL_CAPSULE | Freq: Every day | ORAL | Status: DC
  Administered 2021-11-07: 225 mg via ORAL
  Filled 2021-11-06: qty 3

## 2021-11-06 MED ORDER — ACETAMINOPHEN 325 MG PO TABS
650.0000 mg | ORAL_TABLET | ORAL | Status: DC | PRN
  Administered 2021-11-07: 650 mg via ORAL
  Filled 2021-11-06: qty 2

## 2021-11-06 MED ORDER — SUGAMMADEX SODIUM 200 MG/2ML IV SOLN
INTRAVENOUS | Status: DC | PRN
  Administered 2021-11-06: 200 mg via INTRAVENOUS

## 2021-11-06 MED ORDER — FENTANYL CITRATE (PF) 100 MCG/2ML IJ SOLN: 25.0000 ug | INTRAMUSCULAR | Status: DC | PRN

## 2021-11-06 MED ORDER — METHYLPREDNISOLONE ACETATE 40 MG/ML IJ SUSP
INTRAMUSCULAR | Status: AC
  Filled 2021-11-06: qty 1

## 2021-11-06 MED ORDER — SODIUM CHLORIDE 0.9 % IV SOLN: 250.0000 mL | INTRAVENOUS | Status: DC

## 2021-11-06 MED ORDER — CHLORHEXIDINE GLUCONATE 0.12 % MT SOLN
OROMUCOSAL | Status: AC
  Administered 2021-11-06: 15 mL via OROMUCOSAL
  Filled 2021-11-06: qty 15

## 2021-11-06 MED ORDER — ACETAMINOPHEN 325 MG PO TABS: 325.0000 mg | ORAL_TABLET | ORAL | Status: DC | PRN

## 2021-11-06 MED ORDER — ONDANSETRON HCL 4 MG/2ML IJ SOLN
INTRAMUSCULAR | Status: DC | PRN
  Administered 2021-11-06: 4 mg via INTRAVENOUS

## 2021-11-06 MED ORDER — BUPIVACAINE-EPINEPHRINE (PF) 0.25% -1:200000 IJ SOLN
INTRAMUSCULAR | Status: AC
  Filled 2021-11-06: qty 30

## 2021-11-06 MED ORDER — OXYCODONE HCL 5 MG/5ML PO SOLN: 5.0000 mg | Freq: Once | ORAL | Status: AC | PRN

## 2021-11-06 MED ORDER — MIDAZOLAM HCL 2 MG/2ML IJ SOLN
INTRAMUSCULAR | Status: AC
  Filled 2021-11-06: qty 2

## 2021-11-06 MED ORDER — ROCURONIUM 10MG/ML (10ML) SYRINGE FOR MEDFUSION PUMP - OPTIME
INTRAVENOUS | Status: DC | PRN
  Administered 2021-11-06: 100 mg via INTRAVENOUS

## 2021-11-06 MED ORDER — EPHEDRINE SULFATE-NACL 50-0.9 MG/10ML-% IV SOSY
PREFILLED_SYRINGE | INTRAVENOUS | Status: DC | PRN
  Administered 2021-11-06: 10 mg via INTRAVENOUS
  Administered 2021-11-06 (×3): 5 mg via INTRAVENOUS

## 2021-11-06 MED ORDER — LIDOCAINE 2% (20 MG/ML) 5 ML SYRINGE
INTRAMUSCULAR | Status: AC
  Filled 2021-11-06: qty 5

## 2021-11-06 MED ORDER — SODIUM CHLORIDE 0.9% FLUSH: 3.0000 mL | INTRAVENOUS | Status: DC | PRN

## 2021-11-06 MED ORDER — OXYCODONE HCL 5 MG PO TABS
5.0000 mg | ORAL_TABLET | Freq: Once | ORAL | Status: AC | PRN
  Administered 2021-11-06: 5 mg via ORAL

## 2021-11-06 MED ORDER — PHENOL 1.4 % MT LIQD: 1.0000 | OROMUCOSAL | Status: DC | PRN

## 2021-11-06 MED ORDER — ONDANSETRON HCL 4 MG/2ML IJ SOLN: 4.0000 mg | Freq: Four times a day (QID) | INTRAMUSCULAR | Status: DC | PRN

## 2021-11-06 MED ORDER — PHENYLEPHRINE HCL-NACL 20-0.9 MG/250ML-% IV SOLN
INTRAVENOUS | Status: DC | PRN
  Administered 2021-11-06: 50 ug/min via INTRAVENOUS

## 2021-11-06 MED ORDER — CEFAZOLIN SODIUM-DEXTROSE 2-4 GM/100ML-% IV SOLN
INTRAVENOUS | Status: AC
  Filled 2021-11-06: qty 100

## 2021-11-06 MED ORDER — FENTANYL CITRATE (PF) 250 MCG/5ML IJ SOLN
INTRAMUSCULAR | Status: DC | PRN
  Administered 2021-11-06: 100 ug via INTRAVENOUS
  Administered 2021-11-06 (×3): 50 ug via INTRAVENOUS

## 2021-11-06 MED ORDER — OXYCODONE HCL 5 MG PO TABS
5.0000 mg | ORAL_TABLET | ORAL | Status: DC | PRN
  Administered 2021-11-07 (×2): 5 mg via ORAL
  Filled 2021-11-06 (×2): qty 1

## 2021-11-06 MED ORDER — MENTHOL 3 MG MT LOZG: 1.0000 | LOZENGE | OROMUCOSAL | Status: DC | PRN

## 2021-11-06 MED ORDER — EPHEDRINE 5 MG/ML INJ
INTRAVENOUS | Status: AC
  Filled 2021-11-06: qty 5

## 2021-11-06 MED ORDER — ONDANSETRON HCL 4 MG PO TABS: 4.0000 mg | ORAL_TABLET | Freq: Three times a day (TID) | ORAL | 0 refills | Status: DC | PRN

## 2021-11-06 MED ORDER — LACTATED RINGERS IV SOLN: INTRAVENOUS | Status: DC | PRN

## 2021-11-06 MED ORDER — ROCURONIUM BROMIDE 10 MG/ML (PF) SYRINGE
PREFILLED_SYRINGE | INTRAVENOUS | Status: AC
  Filled 2021-11-06: qty 10

## 2021-11-06 MED ORDER — HYDROCODONE-ACETAMINOPHEN 5-325 MG PO TABS: 1.0000 | ORAL_TABLET | Freq: Four times a day (QID) | ORAL | 0 refills | Status: AC | PRN

## 2021-11-06 MED ORDER — AMLODIPINE BESYLATE 5 MG PO TABS
10.0000 mg | ORAL_TABLET | Freq: Every day | ORAL | Status: DC
  Administered 2021-11-07: 10 mg via ORAL
  Filled 2021-11-06: qty 2

## 2021-11-06 MED ORDER — PRAVASTATIN SODIUM 40 MG PO TABS
40.0000 mg | ORAL_TABLET | Freq: Every day | ORAL | Status: DC
  Administered 2021-11-06: 40 mg via ORAL
  Filled 2021-11-06: qty 1

## 2021-11-06 MED ORDER — CEFAZOLIN SODIUM 1 G IJ SOLR
INTRAMUSCULAR | Status: AC
  Filled 2021-11-06: qty 20

## 2021-11-06 MED ORDER — HYDROMORPHONE HCL 1 MG/ML IJ SOLN: 0.5000 mg | INTRAMUSCULAR | Status: AC | PRN

## 2021-11-06 MED ORDER — CHLORHEXIDINE GLUCONATE 0.12 % MT SOLN: 15.0000 mL | Freq: Once | OROMUCOSAL | Status: AC

## 2021-11-06 MED ORDER — LAMOTRIGINE 100 MG PO TABS
100.0000 mg | ORAL_TABLET | Freq: Two times a day (BID) | ORAL | Status: DC
  Administered 2021-11-06 – 2021-11-07 (×2): 100 mg via ORAL
  Filled 2021-11-06 (×3): qty 1

## 2021-11-06 MED ORDER — ORAL CARE MOUTH RINSE: 15.0000 mL | Freq: Once | OROMUCOSAL | Status: AC

## 2021-11-06 MED ORDER — 0.9 % SODIUM CHLORIDE (POUR BTL) OPTIME
TOPICAL | Status: DC | PRN
  Administered 2021-11-06: 1000 mL

## 2021-11-06 MED ORDER — CEFAZOLIN SODIUM-DEXTROSE 1-4 GM/50ML-% IV SOLN
1.0000 g | Freq: Three times a day (TID) | INTRAVENOUS | Status: AC
  Administered 2021-11-06 – 2021-11-07 (×2): 1 g via INTRAVENOUS
  Filled 2021-11-06 (×2): qty 50

## 2021-11-06 MED ORDER — METHOCARBAMOL 500 MG PO TABS
500.0000 mg | ORAL_TABLET | Freq: Four times a day (QID) | ORAL | Status: DC | PRN
  Administered 2021-11-06 – 2021-11-07 (×2): 500 mg via ORAL
  Filled 2021-11-06 (×2): qty 1

## 2021-11-06 MED ORDER — ACETAMINOPHEN 160 MG/5ML PO SOLN: 325.0000 mg | ORAL | Status: DC | PRN

## 2021-11-06 MED ORDER — LACTATED RINGERS IV SOLN: INTRAVENOUS | Status: DC

## 2021-11-06 MED ORDER — TRANEXAMIC ACID 1000 MG/10ML IV SOLN
2000.0000 mg | Freq: Once | INTRAVENOUS | Status: AC
  Administered 2021-11-06: 2000 mg via TOPICAL
  Filled 2021-11-06: qty 20

## 2021-11-06 MED ORDER — POLYETHYLENE GLYCOL 3350 17 G PO PACK: 17.0000 g | PACK | Freq: Every day | ORAL | Status: DC | PRN

## 2021-11-06 MED ORDER — ONDANSETRON HCL 4 MG/2ML IJ SOLN
INTRAMUSCULAR | Status: AC
  Filled 2021-11-06: qty 2

## 2021-11-06 MED ORDER — OXYCODONE HCL 5 MG PO TABS
ORAL_TABLET | ORAL | Status: AC
  Filled 2021-11-06: qty 1

## 2021-11-06 MED ORDER — FENTANYL CITRATE (PF) 250 MCG/5ML IJ SOLN
INTRAMUSCULAR | Status: AC
  Filled 2021-11-06: qty 5

## 2021-11-06 MED ORDER — AMISULPRIDE (ANTIEMETIC) 5 MG/2ML IV SOLN: 10.0000 mg | Freq: Once | INTRAVENOUS | Status: DC | PRN

## 2021-11-06 MED ORDER — EZETIMIBE 10 MG PO TABS
10.0000 mg | ORAL_TABLET | Freq: Every day | ORAL | Status: DC
  Administered 2021-11-06: 10 mg via ORAL
  Filled 2021-11-06: qty 1

## 2021-11-06 MED ORDER — DEXAMETHASONE SODIUM PHOSPHATE 10 MG/ML IJ SOLN
INTRAMUSCULAR | Status: DC | PRN
  Administered 2021-11-06: 10 mg via INTRAVENOUS

## 2021-11-06 MED ORDER — PROPOFOL 1000 MG/100ML IV EMUL
INTRAVENOUS | Status: AC
  Filled 2021-11-06: qty 100

## 2021-11-06 SURGICAL SUPPLY — 58 items
AGENT HMST KT MTR STRL THRMB (HEMOSTASIS) ×1
BAG COUNTER SPONGE SURGICOUNT (BAG) ×2 IMPLANT
BAG SPNG CNTER NS LX DISP (BAG) ×1
BNDG GAUZE ELAST 4 BULKY (GAUZE/BANDAGES/DRESSINGS) ×2 IMPLANT
CANISTER SUCT 3000ML PPV (MISCELLANEOUS) ×2 IMPLANT
CLSR STERI-STRIP ANTIMIC 1/2X4 (GAUZE/BANDAGES/DRESSINGS) ×2 IMPLANT
CORD BIPOLAR FORCEPS 12FT (ELECTRODE) ×2 IMPLANT
COVER SURGICAL LIGHT HANDLE (MISCELLANEOUS) ×2 IMPLANT
DRAIN CHANNEL 15F RND FF W/TCR (WOUND CARE) IMPLANT
DRAPE SURG 17X23 STRL (DRAPES) ×2 IMPLANT
DRAPE U-SHAPE 47X51 STRL (DRAPES) ×2 IMPLANT
DRSG OPSITE POSTOP 3X4 (GAUZE/BANDAGES/DRESSINGS) ×2 IMPLANT
DRSG OPSITE POSTOP 4X6 (GAUZE/BANDAGES/DRESSINGS) ×1 IMPLANT
DURAPREP 26ML APPLICATOR (WOUND CARE) ×2 IMPLANT
ELECT BLADE 4.0 EZ CLEAN MEGAD (MISCELLANEOUS) ×2
ELECT CAUTERY BLADE 6.4 (BLADE) ×2 IMPLANT
ELECT PENCIL ROCKER SW 15FT (MISCELLANEOUS) ×2 IMPLANT
ELECT REM PT RETURN 9FT ADLT (ELECTROSURGICAL) ×2
ELECTRODE BLDE 4.0 EZ CLN MEGD (MISCELLANEOUS) IMPLANT
ELECTRODE REM PT RTRN 9FT ADLT (ELECTROSURGICAL) ×1 IMPLANT
EVACUATOR SILICONE 100CC (DRAIN) IMPLANT
GLOVE BIO SURGEON STRL SZ 6.5 (GLOVE) ×2 IMPLANT
GLOVE BIOGEL PI IND STRL 6.5 (GLOVE) ×1 IMPLANT
GLOVE BIOGEL PI IND STRL 8.5 (GLOVE) ×1 IMPLANT
GLOVE BIOGEL PI INDICATOR 6.5 (GLOVE) ×1
GLOVE BIOGEL PI INDICATOR 8.5 (GLOVE) ×1
GLOVE SS BIOGEL STRL SZ 8.5 (GLOVE) ×1 IMPLANT
GLOVE SUPERSENSE BIOGEL SZ 8.5 (GLOVE) ×1
GOWN STRL REUS W/ TWL LRG LVL3 (GOWN DISPOSABLE) ×2 IMPLANT
GOWN STRL REUS W/TWL 2XL LVL3 (GOWN DISPOSABLE) ×2 IMPLANT
GOWN STRL REUS W/TWL LRG LVL3 (GOWN DISPOSABLE) ×4
KIT BASIN OR (CUSTOM PROCEDURE TRAY) ×2 IMPLANT
KIT TURNOVER KIT B (KITS) ×2 IMPLANT
NDL SPNL 18GX3.5 QUINCKE PK (NEEDLE) ×2 IMPLANT
NEEDLE 22X1 1/2 (OR ONLY) (NEEDLE) ×2 IMPLANT
NEEDLE SPNL 18GX3.5 QUINCKE PK (NEEDLE) ×4 IMPLANT
NS IRRIG 1000ML POUR BTL (IV SOLUTION) ×2 IMPLANT
PACK LAMINECTOMY ORTHO (CUSTOM PROCEDURE TRAY) ×2 IMPLANT
PACK UNIVERSAL I (CUSTOM PROCEDURE TRAY) ×2 IMPLANT
PAD ARMBOARD 7.5X6 YLW CONV (MISCELLANEOUS) ×5 IMPLANT
PATTIES SURGICAL .5 X.5 (GAUZE/BANDAGES/DRESSINGS) ×1 IMPLANT
PATTIES SURGICAL .5 X1 (DISPOSABLE) ×1 IMPLANT
SPONGE SURGIFOAM ABS GEL 100 (HEMOSTASIS) ×1 IMPLANT
SPONGE T-LAP 4X18 ~~LOC~~+RFID (SPONGE) ×5 IMPLANT
SURGIFLO W/THROMBIN 8M KIT (HEMOSTASIS) ×1 IMPLANT
SUT BONE WAX W31G (SUTURE) ×2 IMPLANT
SUT MNCRL+ AB 3-0 CT1 36 (SUTURE) ×1 IMPLANT
SUT MONOCRYL AB 3-0 CT1 36IN (SUTURE) ×2
SUT VIC AB 1 CT1 18XCR BRD 8 (SUTURE) ×1 IMPLANT
SUT VIC AB 1 CT1 8-18 (SUTURE) ×2
SUT VIC AB 2-0 CT1 18 (SUTURE) ×2 IMPLANT
SYR BULB IRRIG 60ML STRL (SYRINGE) ×2 IMPLANT
SYR CONTROL 10ML LL (SYRINGE) ×2 IMPLANT
SYR TB 1ML LUER SLIP (SYRINGE) ×1 IMPLANT
TOWEL GREEN STERILE (TOWEL DISPOSABLE) ×2 IMPLANT
TOWEL GREEN STERILE FF (TOWEL DISPOSABLE) ×2 IMPLANT
WATER STERILE IRR 1000ML POUR (IV SOLUTION) ×1 IMPLANT
YANKAUER SUCT BULB TIP NO VENT (SUCTIONS) ×1 IMPLANT

## 2021-11-06 NOTE — Brief Op Note (Signed)
11/06/2021  4:09 PM  PATIENT:  [name]  76 y.o. female  PRE-OPERATIVE DIAGNOSIS:  L4-5 Hernatied disc with stenosis and radiculopathy  POST-OPERATIVE DIAGNOSIS:  L4-5 Hernatied disc with stenosis and radiculopathy  PROCEDURE:  Procedure(s) with comments: Lumbar four to five decompression and discectomy (N/A) - 3 C-Bed  SURGEON:  Surgeon(s) and Role:    Melina Schools, MD - Primary  PHYSICIAN ASSISTANT: none  ASSISTANTS: none   ANESTHESIA:   general  EBL:  50 mL   BLOOD ADMINISTERED:none  DRAINS: none   LOCAL MEDICATIONS USED:  MARCAINE, Depo-Medrol, TXA  SPECIMEN:  No Specimen  DISPOSITION OF SPECIMEN:  N/A  COUNTS:  YES  TOURNIQUET:  * No tourniquets in log *  DICTATION: .Dragon Dictation  PLAN OF CARE: Admit for overnight observation  PATIENT DISPOSITION:  PACU - hemodynamically stable.

## 2021-11-06 NOTE — Anesthesia Procedure Notes (Signed)
Procedure Name: Intubation Date/Time: 11/06/2021 1:47 PM Performed by: Claris Che, CRNA Pre-anesthesia Checklist: Patient identified, Emergency Drugs available, Suction available, Patient being monitored and Timeout performed Patient Re-evaluated:Patient Re-evaluated prior to induction Oxygen Delivery Method: Circle system utilized Preoxygenation: Pre-oxygenation with 100% oxygen Induction Type: IV induction and Cricoid Pressure applied Ventilation: Mask ventilation without difficulty Laryngoscope Size: Mac and 4 Grade View: Grade II Tube type: Oral Tube size: 7.5 mm Number of attempts: 1 Airway Equipment and Method: Stylet Placement Confirmation: ETT inserted through vocal cords under direct vision, positive ETCO2 and breath sounds checked- equal and bilateral Secured at: 23 cm Tube secured with: Tape Dental Injury: Teeth and Oropharynx as per pre-operative assessment

## 2021-11-06 NOTE — Discharge Instructions (Signed)

## 2021-11-06 NOTE — Anesthesia Postprocedure Evaluation (Signed)
Anesthesia Post Note  Patient: Rachael Webster  Procedure(s) Performed: Lumbar four to five decompression and discectomy (Spine Lumbar)     Patient location during evaluation: PACU Anesthesia Type: General Level of consciousness: awake and alert Pain management: pain level controlled Vital Signs Assessment: post-procedure vital signs reviewed and stable Respiratory status: spontaneous breathing, nonlabored ventilation, respiratory function stable and patient connected to nasal cannula oxygen Cardiovascular status: blood pressure returned to baseline and stable Postop Assessment: no apparent nausea or vomiting Anesthetic complications: no   No notable events documented.  Last Vitals:  Vitals:   11/06/21 1700 11/06/21 1715  BP: 129/78 131/74  Pulse: 76 77  Resp: 15 16  Temp:    SpO2: 93% 94%    Last Pain:  Vitals:   11/06/21 1715  PainSc: 0-No pain                 Effie Berkshire

## 2021-11-06 NOTE — Transfer of Care (Signed)
Immediate Anesthesia Transfer of Care Note  Patient: Rachael Webster  Procedure(s) Performed: Lumbar four to five decompression and discectomy (Spine Lumbar)  Patient Location: PACU  Anesthesia Type:General  Level of Consciousness: drowsy and patient cooperative  Airway & Oxygen Therapy: Patient Spontanous Breathing and Patient connected to face mask oxygen  Post-op Assessment: Report given to RN and Post -op Vital signs reviewed and stable  Post vital signs: Reviewed and stable  Last Vitals:  Vitals Value Taken Time  BP 118/72   Temp    Pulse 84 11/06/21 1615  Resp 18 11/06/21 1615  SpO2 97 % 11/06/21 1615  Vitals shown include unvalidated device data.  Last Pain:  Vitals:   11/06/21 1109  PainSc: 0-No pain         Complications: No notable events documented.

## 2021-11-06 NOTE — Op Note (Signed)
OPERATIVE REPORT  DATE OF SURGERY: 11/06/2021  PATIENT NAME:  Rachael Webster MRN: [id number] DOB: 1946/03/23  PCP: Kelton Pillar, MD  PRE-OPERATIVE DIAGNOSIS: Lumbar spinal stenosis L4-5 large right posterior lateral disc herniation with cranial migration behind the body of L4 on the right side.  POST-OPERATIVE DIAGNOSIS: Same  PROCEDURE:   1.  Right L4 hemilaminotomy with discectomy and decompression 2.  Left-sided lateral recess/medial facetectomy L4-5  SURGEON:  Melina Schools, MD  PHYSICIAN ASSISTANT: None  ANESTHESIA:   General  EBL: 50 ml   Complications: None  BRIEF HISTORY: Rachael Webster is a 76 y.o. female who presented to my office with complaints of acute severe debilitating right radicular leg pain.  Patient had a positive nerve root tension sign and numbness and dysesthesias in the right lower extremity.  Imaging studies demonstrated a large posterior lateral to the right disc herniation at L4-5 with cranial migration behind the body of L5 4.  There is also some left-sided facet arthrosis and lateral recess stenosis.  Due to the patient's horrific pain we elected to move forward with surgery.  All appropriate risks, benefits, and alternatives to surgery were discussed with the patient and consent was obtained.  PROCEDURE DETAILS: Patient was brought into the operating room and was properly positioned on the operating room table.  After induction with general anesthesia the patient was endotracheally intubated.  A timeout was taken to confirm all important data: including patient, procedure, and the level. Teds, SCD's were applied.   Patient was placed prone on the Wilson frame and all bony prominences well-padded.  The back was then prepped and draped in a standard fashion.  2 needles were placed into the back and an x-ray was taken for localization of our skin incision.  Once I marked out the skin incision I infiltrated with quarter percent  Marcaine with epinephrine.  The midline incision was made and sharp dissection was carried out down to the deep fascia.  I incised the deep fascia and stripped the paraspinal muscles to expose the L4 and L3 spinous process as well as the L4 lamina.  A Penfield 4 was then placed underneath the L4 lamina and an x-ray was taken to confirm that it was at the appropriate level.  Once I confirmed I was at the L4-5 level a very generous laminotomy L4 was performed with a 3 mm Kerrison rongeur.  Due to the cranial migration of the disc fragment I elected to perform a near complete hemilaminectomy of L4 on the right side in order to adequately visualize and remove the disc fragment.  Once the laminotomy was performed I was able to undercut the central portion of the spinous process in order to identify the central raphae of the ligamentum flavum.  With a [facility] I dissected through the ligamentum flavum creating a plane between the thecal sac and the ligamentum flavum.  Using my Kerrison rongeurs I resected the ligamentum flavum from the midline out to the right side.  I then continue to gently dissect into the lateral recess removing the ligamentum flavum and performing a medial facetectomy on this right side.  I then identified the L4 nerve root as it was traversing the disc space and traced down to the L4 foramen.  I decompressed the area until I could palpate and visualize the medial border of the L4 pedicle.    I could now gently mobilize the thecal sac to the left-hand side and expose multiple large fragments of disc material.  Using a fine nerve hook I was able to gently mobilize them and then remove them with the micropituitary rondure.  I removed multiple fragments of disc material which ultimately corresponded to what was seen on the preoperative MRI.  An annulotomy was performed and then I traced the disc herniation back to the disc space itself and decompressed additional small fragments of disc  material.  At this point I can now freely pass my nerve hook level of the L4-5 disc space superiorly to the level of the L3 pedicle.  I could also easily pass my Woodson elevator across the midline and underneath the thecal sac.  I completely decompressed the area.  There were large epidural veins which were identified and coagulated.  Using bipolar electrocautery as well as Floseal I was able to obtain hemostasis.  At this point I then was able to gently mobilize the thecal sac and from the right-hand side visualize a plane between the thecal sac and the ligamentum flavum on the left-hand side.  I then placed my Kerrison rongeur and resected the ligamentum flavum on the left-hand side and tracked down into the lateral recess on the left side I trimmed down the medial aspect of the facet complex in order to decompress this area.  I could now pass my nerve hook on the left side in the lateral recess and out the L4 foramen.  At this point I felt as though the decompression on both sides was adequate.  I then irrigated the wound copiously with normal saline and placed Depo-Medrol over the exposed thecal sac and nerve root for postoperative analgesia.  TXA soaked foam patty was then cut and placed over the laminotomy defect.  I did take a final x-ray with a Penfield 4 in the disc space of L4-5 confirming once again I was at the appropriate level.  I then closed the deep fascia with interrupted #1 Vicryl sutures, superficial with 2-0 Vicryl suture, and a 3-0 Monocryl for the skin.  Steri-Strips and a dry dressing were applied and the patient was ultimately extubated and transferred the PACU without incident.  The end of the case all needle sponge counts were correct.  Melina Schools, MD 11/06/2021 3:59 PM

## 2021-11-06 NOTE — H&P (Signed)
History: Rachael Webster presents  with acute onset of severe radicular right leg pain. Patient cannot recall any specific injury or event. Approximately 3 to 4 weeks ago she woke up with severe pain and has become debilitating. Patient states their pain level is 10/10. The pain significantly interferes with all activities of daily living as well as overall quality of life.  MRI demonstrates a large disc herniation at L4-5 with significant stenosis.  As a result we have elected to move forward with a lumbar decompression and excision of the disc herniation.  Past Medical History:  Diagnosis Date   Abdominal pain    middle of abd, 1 time only   Adenomatous colon polyp    Depression    Fecal incontinence    metamucill helps   FH: stomach ulcer    2001/ per pt, personal hx of ulcer, no family   GERD (gastroesophageal reflux disease)    Headache, chronic daily    Hearing loss    Wears Bil hearing aids.   Hyperlipidemia    Hypertension    Internal hemorrhoids    Osteopenia    Post-operative nausea and vomiting 1986   only 1 time    Allergies  Allergen Reactions   Amitriptyline Hcl     Opposite effect: hyperactive   Asa [Aspirin]     History of stomach ulcer    No current facility-administered medications on file prior to encounter.   Current Outpatient Medications on File Prior to Encounter  Medication Sig Dispense Refill   amLODipine (NORVASC) 10 MG tablet Take 10 mg by mouth daily.      Calcium Carbonate-Vitamin D (CALCIUM 600/VITAMIN D PO) Take 1 tablet by mouth in the morning and at bedtime.     Cholecalciferol (VITAMIN D) 2000 UNITS CAPS Take 2,000 Units by mouth daily.     clonazePAM (KLONOPIN) 0.5 MG tablet Take 0.5 mg by mouth 2 (two) times daily as needed for anxiety.     estradiol (ESTRACE) 0.1 MG/GM vaginal cream Place 1 Applicatorful vaginally every other day.     ezetimibe (ZETIA) 10 MG tablet Take 10 mg by mouth at bedtime.     fluvastatin XL (LESCOL XL) 80 MG 24  hr tablet Take 80 mg by mouth daily.     gabapentin (NEURONTIN) 100 MG capsule Take 100 mg by mouth 2 (two) times daily. Breakfast and Lunch     gabapentin (NEURONTIN) 300 MG capsule Take 300 mg by mouth at bedtime.     lamoTRIgine (LAMICTAL) 100 MG tablet Take 1 tablet (100 mg total) by mouth 2 (two) times daily. 180 tablet 0   metaxalone (SKELAXIN) 800 MG tablet Take 1 tablet (800 mg total) by mouth as needed. 30 tablet 11   Multiple Vitamins-Minerals (CENTRUM SILVER ADULT 50+ PO) Take 1 tablet by mouth daily.     Omega-3 Fatty Acids (FISH OIL) 1200 MG CAPS Take 1,200 mg by mouth 4 (four) times a week.     omeprazole (PRILOSEC) 20 MG capsule Take 20 mg by mouth daily.     oxyCODONE (OXY IR/ROXICODONE) 5 MG immediate release tablet Take 5 mg by mouth every 4 (four) hours as needed for severe pain.     TURMERIC CURCUMIN PO Take 1 capsule by mouth in the morning and at bedtime. 2250 mg per cap     venlafaxine XR (EFFEXOR-XR) 75 MG 24 hr capsule Take 3 capsules daily, keep 08/2021 FU (Patient taking differently: Take 75 mg by mouth in the morning,  at noon, and at bedtime.) 180 capsule 6    Physical Exam: Rachael Webster is a pleasant individual, who appears younger than their stated age.  She is alert and orientated 3. Lungs: Clear to auscultation bilaterally. Cardiac: Regular rate and rhythm no rubs gallops murmurs No shortness of breath, chest pain.  Abdomen is soft and non-tender, negative loss of bowel and bladder control, no rebound tenderness.  Negative: skin lesions abrasions contusions  Peripheral pulses: 2+ dorsalis pedis/posterior tibialis pulses bilaterally. LE compartments are: Soft and nontender.  Gait pattern: Altered gait pattern due to severe radicular right leg pain.  Assistive devices: Cane at home. Patient uses a wheelchair in the office  Neuro: 5/5 motor strength in the lower extremities when tested in the seated position. Cannot test motor strength in the right lower  extremity when standing. Positive straight leg raise test with reproduction of right radicular L5 pain. Positive contralateral straight leg raise test. Negative Babinski test, no clonus, absent sensation with neuropathic pain in the right L5 dermatome. 1+ deep tendon reflexes at the knee absent at the Achilles.  Musculoskeletal: Mild low back pain with palpation and range of motion. No SI joint pain with direct palpation. No hip, knee, ankle pain with isolated joint range of motion  Imaging:  Lumbar x-rays demonstrate slight scoliosis in the upper lumbar spine L1-3 with the neutral level being at L4-5. Mild degenerative disc disease throughout the lumbar spine no spondylolisthesis.  Lumbar MRI: completed on 10/25/2021: No fracture or abnormal marrow signal changes noted. Large central and right lateral disc extrusion at L4-5 producing severe right L5 nerve compression and extending over into the left lateral recess producing moderate left L5 nerve root compression. There is severe central stenosis from the disc herniation. Moderate to severe left L5-S1 neuroforaminal narrowing, severe right L5-S1 neuroforaminal narrowing. There is only mild L5 nerve compression in the foramen and lateral recess compression of the S1 nerve root noted at this level. Mild multilevel degenerative changes throughout the upper lumbar spine.   A/P: Rachael Webster presents  with acute onset of severe radicular right leg pain. Patient cannot recall any specific injury or event. Approximately 3 to 4 weeks ago she woke up with severe pain and has become debilitating. Patient states their pain level is 10/10. The pain significantly interferes with all activities of daily living as well as overall quality of life.  Diagnosis: Patient's clinical exam is consistent with L5 radiculopathy from the large L4-5 disc herniation. Fortunately she does not show signs of cauda equina syndrome but she has definite nerve compression. I have reviewed the  images with the patient and her family and we discussed treatment options. At this point the pain is so severe that it is unlikely that physical therapy will be tolerated. In addition given the size of the disc herniation and the severity of the stenosis I do not think injection therapy is advisable. Based on her clinical symptoms I am recommending a lumbar decompression and discectomy. Because there is extension over to the left side I do believe that bilateral hemilaminotomies and bilateral discectomies would proved to be the most efficient means of addressing the severe central and lateral recess stenosis and nerve compression.  Risks and benefits of lumbar decompression/discectomy: Infection, bleeding, death, stroke, paralysis, ongoing or worse pain, need for additional surgery, leak of spinal fluid, adjacent segment degeneration requiring additional surgery, post-operative hematoma formation that can result in neurological compromise and the need for urgent/emergent re-operation. Loss in bowel and bladder control. Injury  to major vessels that could result in the need for urgent abdominal surgery to stop bleeding. Risk of deep venous thrombosis (DVT) and the need for additional treatment. Recurrent disc herniation resulting in the need for revision surgery, which could include fusion surgery (utilizing instrumentation such as pedicle screws and intervertebral cages).

## 2021-11-07 ENCOUNTER — Encounter (HOSPITAL_COMMUNITY): Payer: Self-pay | Admitting: Orthopedic Surgery

## 2021-11-07 ENCOUNTER — Other Ambulatory Visit: Payer: Self-pay

## 2021-11-07 DIAGNOSIS — M48061 Spinal stenosis, lumbar region without neurogenic claudication: Secondary | ICD-10-CM | POA: Diagnosis not present

## 2021-11-07 DIAGNOSIS — M5116 Intervertebral disc disorders with radiculopathy, lumbar region: Secondary | ICD-10-CM | POA: Diagnosis not present

## 2021-11-07 DIAGNOSIS — Z79899 Other long term (current) drug therapy: Secondary | ICD-10-CM | POA: Diagnosis not present

## 2021-11-07 DIAGNOSIS — I10 Essential (primary) hypertension: Secondary | ICD-10-CM | POA: Diagnosis not present

## 2021-11-07 NOTE — Progress Notes (Signed)
Patient alert and oriented, mae's well, voiding adequate amount of urine, swallowing without difficulty, no c/o pain at time of discharge. Patient discharged home with family. Script and discharged instructions given to patient. Patient and family stated understanding of instructions given. Patient has an appointment with Dr. Brooks  

## 2021-11-07 NOTE — Discharge Summary (Signed)
Patient ID: Rachael Webster MRN: [id number] DOB/AGE: [birth year] 77 y.o.  Admit date: 11/06/2021 Discharge date: 11/07/2021  Admission Diagnoses:  Principal Problem:   Lumbar disc herniation   Discharge Diagnoses:  Principal Problem:   Lumbar disc herniation  status post Procedure(s): Lumbar four to five decompression and discectomy  Past Medical History:  Diagnosis Date   Abdominal pain    middle of abd, 1 time only   Adenomatous colon polyp    Depression    Fecal incontinence    metamucill helps   FH: stomach ulcer    2001/ per pt, personal hx of ulcer, no family   GERD (gastroesophageal reflux disease)    Headache, chronic daily    Hearing loss    Wears Bil hearing aids.   Hyperlipidemia    Hypertension    Internal hemorrhoids    Osteopenia    Post-operative nausea and vomiting 1986   only 1 time    Surgeries: Procedure(s): Lumbar four to five decompression and discectomy on 11/06/2021   Consultants:   Discharged Condition: Improved  Hospital Course: Rachael Webster is an 76 y.o. female who was admitted 11/06/2021 for operative treatment of Lumbar disc herniation. Patient failed conservative treatments (please see the history and physical for the specifics) and had severe unremitting pain that affects sleep, daily activities and work/hobbies. After pre-op clearance, the patient was taken to the operating room on 11/06/2021 and underwent  Procedure(s): Lumbar four to five decompression and discectomy.    Patient was given perioperative antibiotics:  Anti-infectives (From admission, onward)    Start     Dose/Rate Route Frequency Ordered Stop   11/06/21 2000  ceFAZolin (ANCEF) IVPB 1 g/50 mL premix        1 g 100 mL/hr over 30 Minutes Intravenous Every 8 hours 11/06/21 1733 11/07/21 0317   11/06/21 1200  ceFAZolin (ANCEF) IVPB 2g/100 mL premix        2 g 200 mL/hr over 30 Minutes Intravenous 30 min pre-op 11/06/21 1051 11/06/21 1401    11/06/21 1055  ceFAZolin (ANCEF) 2-4 GM/100ML-% IVPB       Note to Pharmacy: Mendel Corning B: cabinet override      11/06/21 1055 11/06/21 2259        Patient was given sequential compression devices and early ambulation to prevent DVT.   Patient benefited maximally from hospital stay and there were no complications. At the time of discharge, the patient was urinating/moving their bowels without difficulty, tolerating a regular diet, pain is controlled with oral pain medications and they have been cleared by PT/OT.   Recent vital signs: Patient Vitals for the past 24 hrs:  BP Temp Temp src Pulse Resp SpO2 Height Weight  11/07/21 0749 (!) 151/74 99 F (37.2 C) Oral 84 16 95 % -- --  11/07/21 0510 129/71 98.6 F (37 C) Oral 77 18 94 % -- --  11/06/21 2341 121/68 98.8 F (37.1 C) Oral 82 18 94 % -- --  11/06/21 2026 117/68 99.3 F (37.4 C) Oral 85 18 93 % -- --  11/06/21 1805 (!) 148/80 97.8 F (36.6 C) -- 85 18 98 % -- --  11/06/21 1715 131/74 97.6 F (36.4 C) -- 77 16 94 % -- --  11/06/21 1700 129/78 -- -- 76 15 93 % -- --  11/06/21 1645 (!) 130/58 -- -- 85 13 93 % -- --  11/06/21 1630 134/76 -- -- 84 15 97 % -- --  11/06/21 1615 139/70  97.6 F (36.4 C) -- 84 18 97 % -- --  11/06/21 1100 (!) 152/81 98.4 F (36.9 C) -- 80 18 93 % '5\' 5"'$  (1.651 m) 63.5 kg     Recent laboratory studies:  Recent Labs    11/04/21 1415  WBC 9.5  HGB 14.2  HCT 41.5  PLT 276  NA 142  K 3.6  CL 109  CO2 27  BUN 16  CREATININE 0.78  GLUCOSE 113*  CALCIUM 10.5*     Discharge Medications:   Allergies as of 11/07/2021       Reactions   Amitriptyline Hcl    Opposite effect: hyperactive   Asa [aspirin]    History of stomach ulcer        Medication List     STOP taking these medications    CALCIUM 600/VITAMIN D PO   CENTRUM SILVER ADULT 50+ PO   Fish Oil 1200 MG Caps   gabapentin 100 MG capsule Commonly known as: NEURONTIN   gabapentin 300 MG capsule Commonly known as:  NEURONTIN   metaxalone 800 MG tablet Commonly known as: SKELAXIN   oxyCODONE 5 MG immediate release tablet Commonly known as: Oxy IR/ROXICODONE   TURMERIC CURCUMIN PO   Vitamin D 50 MCG (2000 UT) Caps       TAKE these medications    amLODipine 10 MG tablet Commonly known as: NORVASC Take 10 mg by mouth daily.   clonazePAM 0.5 MG tablet Commonly known as: KLONOPIN Take 0.5 mg by mouth 2 (two) times daily as needed for anxiety.   estradiol 0.1 MG/GM vaginal cream Commonly known as: ESTRACE Place 1 Applicatorful vaginally every other day.   ezetimibe 10 MG tablet Commonly known as: ZETIA Take 10 mg by mouth at bedtime.   fluvastatin XL 80 MG 24 hr tablet Commonly known as: LESCOL XL Take 80 mg by mouth daily.   HYDROcodone-acetaminophen 5-325 MG tablet Commonly known as: Norco Take 1 tablet by mouth every 6 (six) hours as needed for up to 5 days.   lamoTRIgine 100 MG tablet Commonly known as: LAMICTAL Take 1 tablet (100 mg total) by mouth 2 (two) times daily.   methocarbamol 500 MG tablet Commonly known as: ROBAXIN Take 1 tablet (500 mg total) by mouth every 8 (eight) hours as needed for up to 5 days for muscle spasms.   omeprazole 20 MG capsule Commonly known as: PRILOSEC Take 20 mg by mouth daily.   ondansetron 4 MG tablet Commonly known as: Zofran Take 1 tablet (4 mg total) by mouth every 8 (eight) hours as needed for nausea or vomiting.   venlafaxine XR 75 MG 24 hr capsule Commonly known as: EFFEXOR-XR Take 3 capsules daily, keep 08/2021 FU What changed:  how much to take how to take this when to take this additional instructions        Diagnostic Studies: DG Lumbar Spine 2-3 Views  Result Date: 11/06/2021 CLINICAL DATA:  Lumbar decompression with discectomy. EXAM: LUMBAR SPINE - 2-3 VIEW COMPARISON:  None Available. FINDINGS: Image number 1 demonstrates localizer needles posterior spinal canal at the L4-5 and L5-S1 levels. Image number 2  reveals a surgical instrument posterior to the spinal canal the L4-5 level. IMPRESSION: L4-5 localized in the operating room. These results were called by telephone at the time of interpretation on 11/06/2021 at 2:27 pm to provider Katina Dung, who verbally acknowledged these results. Electronically Signed   By: Franchot Gallo M.D.   On: 11/06/2021 14:29    Discharge  Instructions     Incentive spirometry RT   Complete by: As directed         Follow-up Information     Melina Schools, MD Follow up in 2 week(s).   Specialty: Orthopedic Surgery Why: If symptoms worsen, For suture removal, For wound re-check Contact information: [address] [place] [address] [phone]                 Discharge Plan:  discharge to home  Disposition: Patient is doing exceptionally well.  Preoperative radicular leg pain has resolved.  She still has some occasional discomfort in the thigh on the right side but it is not as severe as it was prior to surgery.  She is ambulating without any difficulty but would like a walker at home to aid in her overall stability.  She will be discharged with appropriate medications and the discharge instructions and follow-up with me in 2 weeks for wound check.    Signed: Dahlia Bailiff for Dr. Melina Schools [facility] [phone] 11/07/2021, 10:58 AM

## 2021-11-07 NOTE — Progress Notes (Signed)
PT Cancellation Note  Patient Details Name: Nyra Anspaugh MRN: [id number] DOB: [birth year]   Cancelled Treatment:    Reason Eval/Treat Not Completed: PT screened, no needs identified, will sign off Per OT, patient ambulating modI with RW. Educated completed by OT. No skilled PT needs identified acutely. PT will sign off.   Jourden Delmont A. Gilford Rile PT, DPT Acute Rehabilitation Services Pager [phone] Office [phone]    Linna Hoff 11/07/2021, 8:41 AM

## 2021-11-07 NOTE — Evaluation (Signed)
Occupational Therapy Evaluation Patient Details Name: Rachael Webster MRN: [id number] DOB: Apr 04, 1946 Today's Date: 11/07/2021   History of Present Illness Patient is a 76 yo female presenting for surgery of L4-L5 decompression and discectomy. PMH includes:  HTN, HLD, GERD, chronic daily headaches   Clinical Impression   Prior to this admission, patient living independently with spouse. Currently, patient requiring min A for lower body bathing and dressing, supervision for transfers and ambulation, and min A to ascend and descend stairs. All education completed with handout provided to patient at end of session. Patient able to verbalize precautions and no further acute needs identified. No OT follow up or acute intervention required. OT signing off at this time, please re-consult if further issues arise.      Recommendations for follow up therapy are one component of a multi-disciplinary discharge planning process, led by the attending physician.  Recommendations may be updated based on patient status, additional functional criteria and insurance authorization.   Follow Up Recommendations  No OT follow up    Assistance Recommended at Discharge Intermittent Supervision/Assistance  Patient can return home with the following A little help with bathing/dressing/bathroom;Assist for transportation;Help with stairs or ramp for entrance    Functional Status Assessment  Patient has had a recent decline in their functional status and demonstrates the ability to make significant improvements in function in a reasonable and predictable amount of time.  Equipment Recommendations  None recommended by OT (Patient has DME needed)    Recommendations for Other Services       Precautions / Restrictions Precautions Precautions: Back Precaution Booklet Issued: Yes (comment) Precaution Comments: Able to recall 2/3 without handout, able to verbalize throughout session after initial  prompting Required Braces or Orthoses: Spinal Brace Spinal Brace: Applied in sitting position;Lumbar corset Restrictions Weight Bearing Restrictions: No      Mobility Bed Mobility Overal bed mobility: Independent             General bed mobility comments: Able to complete log roll without cues to get in and out of bed    Transfers Overall transfer level: Needs assistance Equipment used: Rolling walker (2 wheels) Transfers: Sit to/from Stand Sit to Stand: Supervision           General transfer comment: Supervision for safety      Balance Overall balance assessment: Mild deficits observed, not formally tested                                         ADL either performed or assessed with clinical judgement   ADL Overall ADL's : Needs assistance/impaired Eating/Feeding: Set up;Sitting   Grooming: Set up;Standing;Sitting   Upper Body Bathing: Set up;Standing;Sitting   Lower Body Bathing: Minimal assistance;Sitting/lateral leans;Sit to/from stand   Upper Body Dressing : Set up;Sitting   Lower Body Dressing: Minimal assistance;Sit to/from stand;Sitting/lateral leans;Adhering to back precautions   Toilet Transfer: Supervision/safety;Ambulation;Rolling walker (2 wheels)   Toileting- Clothing Manipulation and Hygiene: Set up;Cueing for back precautions;Sitting/lateral lean       Functional mobility during ADLs: Minimal assistance;Rolling walker (2 wheels) General ADL Comments: Patient presenting with pain and minimal decreased activity tolerance secondary to surgery     Vision Baseline Vision/History: 1 Wears glasses Ability to See in Adequate Light: 0 Adequate Patient Visual Report: No change from baseline       Perception     Praxis  Pertinent Vitals/Pain Pain Assessment Pain Assessment: 0-10 Pain Score: 7  Pain Location: incisional site and back pain Pain Descriptors / Indicators: Discomfort, Dull, Grimacing Pain  Intervention(s): Limited activity within patient's tolerance, Monitored during session, Repositioned, Premedicated before session     Hand Dominance Left   Extremity/Trunk Assessment Upper Extremity Assessment Upper Extremity Assessment: Overall WFL for tasks assessed   Lower Extremity Assessment Lower Extremity Assessment: Overall WFL for tasks assessed   Cervical / Trunk Assessment Cervical / Trunk Assessment: Back Surgery   Communication Communication Communication: No difficulties   Cognition Arousal/Alertness: Awake/alert Behavior During Therapy: WFL for tasks assessed/performed Overall Cognitive Status: Within Functional Limits for tasks assessed                                       General Comments       Exercises     Shoulder Instructions      Home Living Family/patient expects to be discharged to:: Private residence Living Arrangements: Spouse/significant other Available Help at Discharge: Family (Husband) Type of Home: House Home Access: Stairs to enter Technical brewer of Steps: 5 Entrance Stairs-Rails: Can reach both Home Layout: One level     Bathroom Shower/Tub: Gaffer (Handicapped shower)   Bathroom Toilet: Standard Bathroom Accessibility: Yes How Accessible: Accessible via walker Home Equipment: Conservation officer, nature (2 wheels);Cane - quad;Shower seat          Prior Functioning/Environment Prior Level of Function : Independent/Modified Independent;Driving             Mobility Comments: walking with walker for safety ADLs Comments: independent        OT Problem List: Decreased strength;Decreased activity tolerance;Pain      OT Treatment/Interventions:      OT Goals(Current goals can be found in the care plan section) Acute Rehab OT Goals Patient Stated Goal: Be in less pain OT Goal Formulation: With patient Time For Goal Achievement: 11/21/21 Potential to Achieve Goals: Good ADL Goals Pt Will Perform  Lower Body Bathing: Independently;sitting/lateral leans;sit to/from stand Pt Will Perform Lower Body Dressing: Independently;sitting/lateral leans;sit to/from stand;with adaptive equipment Pt Will Transfer to Toilet: Independently;ambulating;regular height toilet  OT Frequency:      Co-evaluation              AM-PAC OT "6 Clicks" Daily Activity     Outcome Measure Help from another person eating meals?: A Little Help from another person taking care of personal grooming?: A Little Help from another person toileting, which includes using toliet, bedpan, or urinal?: A Little Help from another person bathing (including washing, rinsing, drying)?: A Little Help from another person to put on and taking off regular upper body clothing?: A Little Help from another person to put on and taking off regular lower body clothing?: A Little 6 Click Score: 18   End of Session Equipment Utilized During Treatment: Gait belt;Rolling walker (2 wheels);Back brace Nurse Communication: Mobility status  Activity Tolerance: Patient tolerated treatment well Patient left: in bed;with call bell/phone within reach  OT Visit Diagnosis: Unsteadiness on feet (R26.81);Other abnormalities of gait and mobility (R26.89)                Time: 4854-6270 OT Time Calculation (min): 31 min Charges:  OT General Charges $OT Visit: 1 Visit OT Evaluation $OT Eval Moderate Complexity: 1 Mod OT Treatments $Self Care/Home Management : 8-22 mins  Corinne Ports E. Cayce Paschal, OTR/L  Acute Rehabilitation Services [place] 11/07/2021, 9:00 AM

## 2021-12-18 DIAGNOSIS — M545 Low back pain, unspecified: Secondary | ICD-10-CM | POA: Diagnosis not present

## 2021-12-24 DIAGNOSIS — H5203 Hypermetropia, bilateral: Secondary | ICD-10-CM | POA: Diagnosis not present

## 2021-12-29 ENCOUNTER — Other Ambulatory Visit: Payer: Self-pay | Admitting: *Deleted

## 2021-12-29 MED ORDER — LAMOTRIGINE 100 MG PO TABS: 100.0000 mg | ORAL_TABLET | Freq: Two times a day (BID) | ORAL | 0 refills | Status: DC

## 2022-01-05 NOTE — Progress Notes (Unsigned)
Rachael Webster: Rachael Webster DOB: 07-Oct-1945  REASON FOR VISIT: follow up HISTORY FROM: Rachael Webster PRIMARY NEUROLOGIST: Dr. Jaynee Eagles   HISTORY OF PRESENT ILLNESS: Today 01/05/22:  01/01/21: Rachael Webster is a 76 year old female with a history of tension type headaches and migraine headaches.  She returns today for follow-up.  Overall she states that she has been doing well.  She states that her last tension headache was over 2 weeks ago.  She takes Lamictal 100 mg twice a day and Effexor 225 mg daily.  She finds both of these medications beneficial.  She also uses Skelaxin on occasion and reports that it helps "some."  She has scheduled an appointment with psychiatry to discuss her sleep her appointment is in September.  She returns today for an evaluation.  HISTORY (copied from Dr. Cathren Laine note) Rachael Webster is a 76 y.o. female here as requested by Amil Amen, MD for migraines. PMHx migraine with aura and without status migrainosus not intractable, persistent disorder of initiating or maintaining sleep.  I reviewed Dr. Delene Ruffini notes, as well as Dr. Joanna Puff notes who was her previous neurologist, Rachael Webster doing well on Effexor and Lamictal.  She described her migraines as severe, moderate mild, per Dr. Joanna Puff notes, location of the headache was the back of the head and neck which would last for days, dull tight, sensitivity to noise and odor, no nausea vomiting sensitivity to light or worsening with movement activities or dizziness.  Headache triggers include fatigue, worrying, odors, medications, hormone changes and emotional stress.  At Dr. Joanna Puff last appointment overall she was doing well, small tension headaches from time to time, it appears she is on Ambien at bedtime   From a thorough review of records medications that Rachael Webster has tried that can be used in migraine management include: Aspirin, capsaicin, Tylenol for, Darvocet, Excedrin, tramadol, DHE injection,  Imitrex, Midrin, zolmitriptan, Inderal/propranolol, Norvasc/amlodipine and atenolol, Benadryl, Dramamine and Phenergan, Aleve, Naprosyn, indomethacin, Toradol, Flexeril, Depakote, Neurontin/gabapentin, topiramate, prednisone, melatonin, ramelteon and Silenor for sleeping, paroxetine, fluoxetine, mirtazapine and doxepin, Botox, Myobloc and trigger point injections.   For years, "many many many" years she lived with rebound headache and did not know it. She was taking daily medications and daily hydrocodone, when she found out it made a huge difference. Dr. Sima Matas stopped her daily oxy and it really helped. She significantly improved after that. She has headaches in the back of the head bilaterally and working in the yard will irritate her neck and when that happens metaxalone. She has a dull pressure in the back of the head, no shooting or throbbing but will radiate to behind the eyes, she may have it once a week however worse over the last 6 weeks due to stress. But usually her baseline is once a week, it can last a whole day even treated with tylenol. Over the last few weeks she has been taking medications more. Not throbbing, no light sensitivity but can ave sound sensitivity when the headache is very bad, she has a lot of neck pain usually especially with a headache. Neck pain triggers especially is sleeping on it the wrong way or ding a lot of yard work. She says Lamotrigine also helped with headaches y possibly helping with her mood as she had back headaches once a month with some depression and the lamotrigine.   She has very poor sleep, she uses [place] and she doesn't want to, she goes to bed at 1130 and takes an [place], she will read  for 45 minutes and then she will lay down and she is wide awake. She will lay there as long as she will tolerate it, she gets up at 130-2am and plays a computer game, then she goes to bed and sleeps late. I recommended sleep counseling, she had a sleep study.    Reviewed  notes, labs and imaging from outside physicians, which showed:    REVIEW OF SYSTEMS: Out of a complete 14 system review of symptoms, the Rachael Webster complains only of the following symptoms, and all other reviewed systems are negative.  See HPI  ALLERGIES: Allergies  Allergen Reactions   Amitriptyline Hcl     Opposite effect: hyperactive   Asa [Aspirin]     History of stomach ulcer    HOME MEDICATIONS: Outpatient Medications Prior to Visit  Medication Sig Dispense Refill   amLODipine (NORVASC) 10 MG tablet Take 10 mg by mouth daily.      clonazePAM (KLONOPIN) 0.5 MG tablet Take 0.5 mg by mouth 2 (two) times daily as needed for anxiety.     estradiol (ESTRACE) 0.1 MG/GM vaginal cream Place 1 Applicatorful vaginally every other day.     ezetimibe (ZETIA) 10 MG tablet Take 10 mg by mouth at bedtime.     fluvastatin XL (LESCOL XL) 80 MG 24 hr tablet Take 80 mg by mouth daily.     lamoTRIgine (LAMICTAL) 100 MG tablet Take 1 tablet (100 mg total) by mouth 2 (two) times daily. 180 tablet 0   omeprazole (PRILOSEC) 20 MG capsule Take 20 mg by mouth daily.     ondansetron (ZOFRAN) 4 MG tablet Take 1 tablet (4 mg total) by mouth every 8 (eight) hours as needed for nausea or vomiting. 20 tablet 0   venlafaxine XR (EFFEXOR-XR) 75 MG 24 hr capsule Take 3 capsules daily, keep 08/2021 FU (Rachael Webster taking differently: Take 75 mg by mouth in the morning, at noon, and at bedtime.) 180 capsule 6   No facility-administered medications prior to visit.    PAST MEDICAL HISTORY: Past Medical History:  Diagnosis Date   Abdominal pain    middle of abd, 1 time only   Adenomatous colon polyp    Depression    Fecal incontinence    metamucill helps   FH: stomach ulcer    2001/ per pt, personal hx of ulcer, no family   GERD (gastroesophageal reflux disease)    Headache, chronic daily    Hearing loss    Wears Bil hearing aids.   Hyperlipidemia    Hypertension    Internal hemorrhoids    Osteopenia     Post-operative nausea and vomiting 1986   only 1 time    PAST SURGICAL HISTORY: Past Surgical History:  Procedure Laterality Date   ABDOMINAL HYSTERECTOMY  1989   BREAST BIOPSY Left 2016   BREAST BIOPSY Right 01/06/2018   x2   BREAST EXCISIONAL BIOPSY Right    CHOLECYSTECTOMY  2007   EXPLORATORY LAPAROTOMY  08/24/1999   exploratory laparotomy with Phillip Heal patch closoure of perforated duodenal ulcer   LUMBAR LAMINECTOMY/DECOMPRESSION MICRODISCECTOMY N/A 11/06/2021   Procedure: Lumbar four to five decompression and discectomy;  Surgeon: Melina Schools, MD;  Location: [facility];  Service: Orthopedics;  Laterality: N/A;  3 C-Bed   [place]   right   TONSILLECTOMY  1956   TUBAL LIGATION  1976   WRIST SURGERY  11/26/2016   right wrist    FAMILY HISTORY: Family History  Adopted: Yes  Problem Relation  Age of Onset   Colon cancer Neg Hx    Stomach cancer Neg Hx     SOCIAL HISTORY: Social History   Socioeconomic History   Marital status: Married    Spouse name: Not on file   Number of children: Not on file   Years of education: Not on file   Highest education level: Not on file  Occupational History   Not on file  Tobacco Use   Smoking status: Never   Smokeless tobacco: Never  Vaping Use   Vaping Use: Never used  Substance and Sexual Activity   Alcohol use: No   Drug use: No   Sexual activity: Not on file  Other Topics Concern   Not on file  Social History Narrative   Lives at home with spouse   Left handed   Caffeine: maybe 4 oz per day    Social Determinants of Health   Financial Resource Strain: Not on file  Food Insecurity: Not on file  Transportation Needs: Not on file  Physical Activity: Not on file  Stress: Not on file  Social Connections: Not on file  Intimate Partner Violence: Not on file      PHYSICAL EXAM  There were no vitals filed for this visit.  There is no height or weight on file to calculate BMI.  Generalized: Well  developed, in no acute distress   Neurological examination  Mentation: Alert oriented to time, place, history taking. Follows all commands speech and language fluent Cranial nerve II-XII: Pupils were equal round reactive to light. Extraocular movements were full, visual field were full on confrontational test. Facial sensation and strength were normal. Uvula tongue midline. Head turning and shoulder shrug  were normal and symmetric. Motor: The motor testing reveals 5 over 5 strength of all 4 extremities. Good symmetric motor tone is noted throughout.  Sensory: Sensory testing is intact to soft touch on all 4 extremities. No evidence of extinction is noted.  Coordination: Cerebellar testing reveals good finger-nose-finger and heel-to-shin bilaterally.  Gait and station: Gait is normal.  Reflexes: Deep tendon reflexes are symmetric and normal bilaterally.   DIAGNOSTIC DATA (LABS, IMAGING, TESTING) - I reviewed Rachael Webster records, labs, notes, testing and imaging myself where available.  Lab Results  Component Value Date   WBC 9.5 11/04/2021   HGB 14.2 11/04/2021   HCT 41.5 11/04/2021   MCV 99.8 11/04/2021   PLT 276 11/04/2021      Component Value Date/Time   NA 142 11/04/2021 1415   K 3.6 11/04/2021 1415   CL 109 11/04/2021 1415   CO2 27 11/04/2021 1415   GLUCOSE 113 (H) 11/04/2021 1415   BUN 16 11/04/2021 1415   CREATININE 0.78 11/04/2021 1415   CALCIUM 10.5 (H) 11/04/2021 1415   GFRNONAA >60 11/04/2021 1415   GFRAA  11/07/2008 1345    >60        The eGFR has been calculated using the MDRD equation. This calculation has not been validated in all clinical situations. eGFR's persistently <60 mL/min signify possible Chronic Kidney Disease.      ASSESSMENT AND PLAN 76 y.o. year old female  has a past medical history of Abdominal pain, Adenomatous colon polyp, Depression, Fecal incontinence, FH: stomach ulcer, GERD (gastroesophageal reflux disease), Headache, chronic daily,  Hearing loss, Hyperlipidemia, Hypertension, Internal hemorrhoids, Osteopenia, and Post-operative nausea and vomiting (1986). here with:  1.  Migraine headaches 2.  Tension type headaches  --Continue Lamictal 100 mg twice a day --Continue Effexor 225 mg  daily --Advised if symptoms worsen or she develops new symptoms she should let us know --Follow-up in 1 year or sooner if needed     Ward Givens, MSN, NP-C 01/05/2022, 9:28 AM [facility] [address], [place], [place] [address] [phone]

## 2022-01-06 ENCOUNTER — Ambulatory Visit: Payer: Medicare Other | Admitting: Adult Health

## 2022-01-06 ENCOUNTER — Encounter: Payer: Self-pay | Admitting: Adult Health

## 2022-01-06 VITALS — BP 118/76 | HR 80 | Ht 65.0 in | Wt 143.8 lb

## 2022-01-06 DIAGNOSIS — M5481 Occipital neuralgia: Secondary | ICD-10-CM | POA: Diagnosis not present

## 2022-01-06 DIAGNOSIS — Z5181 Encounter for therapeutic drug level monitoring: Secondary | ICD-10-CM | POA: Diagnosis not present

## 2022-01-06 DIAGNOSIS — G43009 Migraine without aura, not intractable, without status migrainosus: Secondary | ICD-10-CM | POA: Diagnosis not present

## 2022-01-06 MED ORDER — VENLAFAXINE HCL ER 75 MG PO CP24: ORAL_CAPSULE | ORAL | 6 refills | Status: DC

## 2022-01-06 MED ORDER — LAMOTRIGINE 100 MG PO TABS: 100.0000 mg | ORAL_TABLET | Freq: Two times a day (BID) | ORAL | 3 refills | Status: DC

## 2022-01-07 ENCOUNTER — Other Ambulatory Visit: Payer: Self-pay | Admitting: *Deleted

## 2022-01-07 MED ORDER — VENLAFAXINE HCL ER 75 MG PO CP24: ORAL_CAPSULE | ORAL | 3 refills | Status: DC

## 2022-01-07 NOTE — Telephone Encounter (Addendum)
Received fax from [facility]. Needing clarification about her venlafaxine XR  She is taking '225mg'$  po daily.  I gave verbal for 90 day supply  270 caps and 3 refills.  Spoke to [employer] Pharmacist. [facility].

## 2022-01-08 LAB — LAMOTRIGINE LEVEL: Lamotrigine Lvl: 4.6 ug/mL (ref 2.0–20.0)

## 2022-02-17 DIAGNOSIS — F39 Unspecified mood [affective] disorder: Secondary | ICD-10-CM | POA: Diagnosis not present

## 2022-02-17 DIAGNOSIS — I129 Hypertensive chronic kidney disease with stage 1 through stage 4 chronic kidney disease, or unspecified chronic kidney disease: Secondary | ICD-10-CM | POA: Diagnosis not present

## 2022-02-17 DIAGNOSIS — N182 Chronic kidney disease, stage 2 (mild): Secondary | ICD-10-CM | POA: Diagnosis not present

## 2022-02-17 DIAGNOSIS — E782 Mixed hyperlipidemia: Secondary | ICD-10-CM | POA: Diagnosis not present

## 2022-02-17 DIAGNOSIS — G2581 Restless legs syndrome: Secondary | ICD-10-CM | POA: Diagnosis not present

## 2022-03-09 DIAGNOSIS — Z23 Encounter for immunization: Secondary | ICD-10-CM | POA: Diagnosis not present

## 2022-03-11 ENCOUNTER — Encounter: Payer: Self-pay | Admitting: Internal Medicine

## 2022-04-15 DIAGNOSIS — L814 Other melanin hyperpigmentation: Secondary | ICD-10-CM | POA: Diagnosis not present

## 2022-04-15 DIAGNOSIS — L578 Other skin changes due to chronic exposure to nonionizing radiation: Secondary | ICD-10-CM | POA: Diagnosis not present

## 2022-04-15 DIAGNOSIS — Z86018 Personal history of other benign neoplasm: Secondary | ICD-10-CM | POA: Diagnosis not present

## 2022-04-15 DIAGNOSIS — L821 Other seborrheic keratosis: Secondary | ICD-10-CM | POA: Diagnosis not present

## 2022-07-29 DIAGNOSIS — K080 Exfoliation of teeth due to systemic causes: Secondary | ICD-10-CM | POA: Diagnosis not present

## 2022-08-12 DIAGNOSIS — K080 Exfoliation of teeth due to systemic causes: Secondary | ICD-10-CM | POA: Diagnosis not present

## 2022-08-21 DIAGNOSIS — N183 Chronic kidney disease, stage 3 unspecified: Secondary | ICD-10-CM | POA: Diagnosis not present

## 2022-08-21 DIAGNOSIS — F39 Unspecified mood [affective] disorder: Secondary | ICD-10-CM | POA: Diagnosis not present

## 2022-08-21 DIAGNOSIS — E782 Mixed hyperlipidemia: Secondary | ICD-10-CM | POA: Diagnosis not present

## 2022-08-21 DIAGNOSIS — Z0000 Encounter for general adult medical examination without abnormal findings: Secondary | ICD-10-CM | POA: Diagnosis not present

## 2022-08-21 DIAGNOSIS — I129 Hypertensive chronic kidney disease with stage 1 through stage 4 chronic kidney disease, or unspecified chronic kidney disease: Secondary | ICD-10-CM | POA: Diagnosis not present

## 2022-08-26 ENCOUNTER — Other Ambulatory Visit: Payer: Self-pay | Admitting: Internal Medicine

## 2022-08-26 DIAGNOSIS — M810 Age-related osteoporosis without current pathological fracture: Secondary | ICD-10-CM

## 2022-11-09 ENCOUNTER — Other Ambulatory Visit: Payer: Self-pay | Admitting: Adult Health

## 2022-11-26 ENCOUNTER — Other Ambulatory Visit: Payer: Self-pay | Admitting: Internal Medicine

## 2022-11-26 DIAGNOSIS — Z0000 Encounter for general adult medical examination without abnormal findings: Secondary | ICD-10-CM

## 2022-11-30 ENCOUNTER — Ambulatory Visit
Admission: RE | Admit: 2022-11-30 | Discharge: 2022-11-30 | Disposition: A | Discharge status: Home / Self Care | Payer: Medicare Other | Source: Ambulatory Visit | Attending: Internal Medicine | Admitting: Internal Medicine

## 2022-11-30 DIAGNOSIS — Z1231 Encounter for screening mammogram for malignant neoplasm of breast: Secondary | ICD-10-CM | POA: Diagnosis not present

## 2022-11-30 DIAGNOSIS — Z0000 Encounter for general adult medical examination without abnormal findings: Secondary | ICD-10-CM

## 2023-01-07 ENCOUNTER — Encounter: Payer: Self-pay | Admitting: Adult Health

## 2023-01-07 ENCOUNTER — Ambulatory Visit: Payer: Medicare Other | Admitting: Adult Health

## 2023-01-07 VITALS — BP 102/66 | HR 75 | Ht 60.0 in | Wt 144.0 lb

## 2023-01-07 DIAGNOSIS — Z5181 Encounter for therapeutic drug level monitoring: Secondary | ICD-10-CM | POA: Diagnosis not present

## 2023-01-07 DIAGNOSIS — M5481 Occipital neuralgia: Secondary | ICD-10-CM | POA: Diagnosis not present

## 2023-01-07 DIAGNOSIS — G43009 Migraine without aura, not intractable, without status migrainosus: Secondary | ICD-10-CM | POA: Diagnosis not present

## 2023-01-07 DIAGNOSIS — G44209 Tension-type headache, unspecified, not intractable: Secondary | ICD-10-CM

## 2023-01-07 NOTE — Progress Notes (Signed)
PATIENT: Rachael Webster DOB: May 21, 1946  REASON FOR VISIT: follow up HISTORY FROM: patient PRIMARY NEUROLOGIST: Dr. Lucia Gaskins   Chief Complaint  Patient presents with   Follow-up    Pt in 4 Pt here for headache f/u Pt states 7 tension headaches in last month Pt states tight muscle pain in back of head      HISTORY OF PRESENT ILLNESS: Today 01/07/23:  Rachael Webster is a 77 y.o. female with a history of tension type headaches and migraine headaches. Returns today for follow-up.  She remains on Lamictal 100 mg twice a day and Effexor 225 mg daily.  Overall she feels that she is doing well.  Has approximately 7 headaches  per month. Will take two extra strength tylenol daily for leg pain. Will take an additional tylenol if she has a headache.  Tylenol typically resolves her tension headache within 30 minutes.  Not necessarily interested in any new medication at this time.   01/06/22: Rachael Webster is a 77 year old female with a history of tension type headaches and migraine headaches.  She returns today for follow-up.  Overall she feels that she is doing well.  Continues on Lamictal 100 mg twice a day and Effexor 225 mg daily. Reports that headaches are better. She has a headache 2-3  times a week -tension headaches. Uses tylenol but doesn't normally help. Headache will resolve in two hours. On pain scale 5/10. Always in the Back of the head and feels like a squeezing sensation. Doesn't want to make any changes right now. Has tried multiple medications in the past.   01/01/21: [name] is a 77 year old female with a history of tension type headaches and migraine headaches.  She returns today for follow-up.  Overall she states that she has been doing well.  She states that her last tension headache was over 2 weeks ago.  She takes Lamictal 100 mg twice a day and Effexor 225 mg daily.  She finds both of these medications beneficial.  She also uses Skelaxin on occasion and  reports that it helps "some."  She has scheduled an appointment with psychiatry to discuss her sleep her appointment is in September.  She returns today for an evaluation.  HISTORY (copied from Dr. Trevor Mace note) Rachael Webster is a 77 y.o. female here as requested by Annia Belt, MD for migraines. PMHx migraine with aura and without status migrainosus not intractable, persistent disorder of initiating or maintaining sleep.  I reviewed Dr. Jone Baseman notes, as well as Dr. Eulis Manly notes who was her previous neurologist, patient doing well on Effexor and Lamictal.  She described her migraines as severe, moderate mild, per Dr. Eulis Manly notes, location of the headache was the back of the head and neck which would last for days, dull tight, sensitivity to noise and odor, no nausea vomiting sensitivity to light or worsening with movement activities or dizziness.  Headache triggers include fatigue, worrying, odors, medications, hormone changes and emotional stress.  At Dr. Eulis Manly last appointment overall she was doing well, small tension headaches from time to time, it appears she is on Ambien at bedtime   From a thorough review of records medications that patient has tried that can be used in migraine management include: Aspirin, capsaicin, Tylenol for, Darvocet, Excedrin, tramadol, DHE injection, Imitrex, Midrin, zolmitriptan, Inderal/propranolol, Norvasc/amlodipine and atenolol, Benadryl, Dramamine and Phenergan, Aleve, Naprosyn, indomethacin, Toradol, Flexeril, Depakote, Neurontin/gabapentin, topiramate, prednisone, melatonin, ramelteon and Silenor for sleeping, paroxetine, fluoxetine, mirtazapine and doxepin, Botox, Myobloc  and trigger point injections.   For years, "many many many" years she lived with rebound headache and did not know it. She was taking daily medications and daily hydrocodone, when she found out it made a huge difference. Dr. Catalina Lunger stopped her daily oxy and it really helped.  She significantly improved after that. She has headaches in the back of the head bilaterally and working in the yard will irritate her neck and when that happens metaxalone. She has a dull pressure in the back of the head, no shooting or throbbing but will radiate to behind the eyes, she may have it once a week however worse over the last 6 weeks due to stress. But usually her baseline is once a week, it can last a whole day even treated with tylenol. Over the last few weeks she has been taking medications more. Not throbbing, no light sensitivity but can ave sound sensitivity when the headache is very bad, she has a lot of neck pain usually especially with a headache. Neck pain triggers especially is sleeping on it the wrong way or ding a lot of yard work. She says Lamotrigine also helped with headaches y possibly helping with her mood as she had back headaches once a month with some depression and the lamotrigine.   She has very poor sleep, she uses [place] and she doesn't want to, she goes to bed at 1130 and takes an [place], she will read for 45 minutes and then she will lay down and she is wide awake. She will lay there as long as she will tolerate it, she gets up at 130-2am and plays a computer game, then she goes to bed and sleeps late. I recommended sleep counseling, she had a sleep study.    Reviewed notes, labs and imaging from outside physicians, which showed:    REVIEW OF SYSTEMS: Out of a complete 14 system review of symptoms, the patient complains only of the following symptoms, and all other reviewed systems are negative.  See HPI  ALLERGIES: Allergies  Allergen Reactions   Amitriptyline Hcl     Opposite effect: hyperactive   Asa [Aspirin]     History of stomach ulcer    HOME MEDICATIONS: Outpatient Medications Prior to Visit  Medication Sig Dispense Refill   amLODipine (NORVASC) 10 MG tablet Take 10 mg by mouth daily.      clonazePAM (KLONOPIN) 0.5 MG tablet Take 0.5 mg by mouth  2 (two) times daily as needed for anxiety.     estradiol (ESTRACE) 0.1 MG/GM vaginal cream Place 1 Applicatorful vaginally every other day.     ezetimibe (ZETIA) 10 MG tablet Take 10 mg by mouth at bedtime.     fluvastatin XL (LESCOL XL) 80 MG 24 hr tablet Take 80 mg by mouth daily.     lamoTRIgine (LAMICTAL) 100 MG tablet Take 1 tablet (100 mg total) by mouth 2 (two) times daily. 180 tablet 3   omeprazole (PRILOSEC) 20 MG capsule Take 20 mg by mouth daily.     ondansetron (ZOFRAN) 4 MG tablet Take 1 tablet (4 mg total) by mouth every 8 (eight) hours as needed for nausea or vomiting. 20 tablet 0   venlafaxine XR (EFFEXOR-XR) 75 MG 24 hr capsule TAKE 3 CAPSULES BY MOUTH DAILY 270 capsule 3   No facility-administered medications prior to visit.    PAST MEDICAL HISTORY: Past Medical History:  Diagnosis Date   Abdominal pain    middle of abd, 1 time only  Adenomatous colon polyp    Depression    Fecal incontinence    metamucill helps   FH: stomach ulcer    2001/ per pt, personal hx of ulcer, no family   GERD (gastroesophageal reflux disease)    Headache, chronic daily    Hearing loss    Wears Bil hearing aids.   Hyperlipidemia    Hypertension    Internal hemorrhoids    Osteopenia    Post-operative nausea and vomiting 1986   only 1 time    PAST SURGICAL HISTORY: Past Surgical History:  Procedure Laterality Date   ABDOMINAL HYSTERECTOMY  1989   BREAST BIOPSY Left 2016   BREAST BIOPSY Right 01/06/2018   x2   BREAST EXCISIONAL BIOPSY Right    CHOLECYSTECTOMY  2007   EXPLORATORY LAPAROTOMY  08/24/1999   exploratory laparotomy with Cheree Ditto patch closoure of perforated duodenal ulcer   LUMBAR LAMINECTOMY/DECOMPRESSION MICRODISCECTOMY N/A 11/06/2021   Procedure: Lumbar four to five decompression and discectomy;  Surgeon: Venita Lick, MD;  Location: [facility] OR;  Service: Orthopedics;  Laterality: N/A;  3 C-Bed   ROTATOR CUFF REPAIR  1998   right   TONSILLECTOMY  1956   TUBAL  LIGATION  1976   WRIST SURGERY  11/26/2016   right wrist    FAMILY HISTORY: Family History  Adopted: Yes  Problem Relation Age of Onset   Colon cancer Neg Hx    Stomach cancer Neg Hx    Headache Neg Hx     SOCIAL HISTORY: Social History   Socioeconomic History   Marital status: Married    Spouse name: Not on file   Number of children: Not on file   Years of education: Not on file   Highest education level: Not on file  Occupational History   Not on file  Tobacco Use   Smoking status: Never   Smokeless tobacco: Never  Vaping Use   Vaping status: Never Used  Substance and Sexual Activity   Alcohol use: No   Drug use: No   Sexual activity: Not on file  Other Topics Concern   Not on file  Social History Narrative   Lives at home with spouse   Left handed   Caffeine: maybe 4 oz per day    Social Determinants of Health   Financial Resource Strain: Not on file  Food Insecurity: Not on file  Transportation Needs: Not on file  Physical Activity: Not on file  Stress: Not on file  Social Connections: Unknown (10/31/2021)   Received from [facility]   Social Network    Social Network: Not on file  Intimate Partner Violence: Unknown (09/22/2021)   [facility], [facility]   HITS    Physically Hurt: Not on file    Insult or Talk Down To: Not on file    Threaten Physical Harm: Not on file    Scream or Curse: Not on file      PHYSICAL EXAM  Vitals:   01/07/23 1107  BP: 102/66  Pulse: 75  Weight: 144 lb (65.3 kg)  Height: 5' (1.524 m)     Body mass index is 28.12 kg/m.  Generalized: Well developed, in no acute distress   Neurological examination  Mentation: Alert oriented to time, place, history taking. Follows all commands speech and language fluent Cranial nerve II-XII: Pupils were equal round reactive to light. Extraocular movements were full, visual field were full on confrontational test. Facial sensation and  strength were normal. Uvula  tongue midline. Head turning and shoulder shrug  were normal and symmetric. Motor: The motor testing reveals 5 over 5 strength of all 4 extremities. Good symmetric motor tone is noted throughout.  Sensory: Sensory testing is intact to soft touch on all 4 extremities. No evidence of extinction is noted.  Coordination: Cerebellar testing reveals good finger-nose-finger and heel-to-shin bilaterally.  Gait and station: Gait is normal.  Reflexes: Deep tendon reflexes are symmetric and normal bilaterally.   DIAGNOSTIC DATA (LABS, IMAGING, TESTING) - I reviewed patient records, labs, notes, testing and imaging myself where available.  Lab Results  Component Value Date   WBC 9.5 11/04/2021   HGB 14.2 11/04/2021   HCT 41.5 11/04/2021   MCV 99.8 11/04/2021   PLT 276 11/04/2021      Component Value Date/Time   NA 142 11/04/2021 1415   K 3.6 11/04/2021 1415   CL 109 11/04/2021 1415   CO2 27 11/04/2021 1415   GLUCOSE 113 (H) 11/04/2021 1415   BUN 16 11/04/2021 1415   CREATININE 0.78 11/04/2021 1415   CALCIUM 10.5 (H) 11/04/2021 1415   GFRNONAA >60 11/04/2021 1415   GFRAA  11/07/2008 1345    >60        The eGFR has been calculated using the MDRD equation. This calculation has not been validated in all clinical situations. eGFR's persistently <60 mL/min signify possible Chronic Kidney Disease.      ASSESSMENT AND PLAN 77 y.o. year old female  has a past medical history of Abdominal pain, Adenomatous colon polyp, Depression, Fecal incontinence, FH: stomach ulcer, GERD (gastroesophageal reflux disease), Headache, chronic daily, Hearing loss, Hyperlipidemia, Hypertension, Internal hemorrhoids, Osteopenia, and Post-operative nausea and vomiting (1986). here with:  1.  Migraine headaches 2.  Tension type headaches  --Continue Lamictal 100 mg twice a day --Continue Effexor 225 mg daily --Check Lamictal level  --Advised if symptoms worsen or she develops new  symptoms she should let us know --Follow-up in 1 year or sooner if needed     Butch Penny, MSN, NP-C 01/07/2023, 10:57 AM [facility] [address] [place], [place] [address] [phone]

## 2023-01-07 NOTE — Patient Instructions (Addendum)
Your Plan:  Continue Lamictal and Effexor Blood work today      Thank you for coming to see [place] at [facility]. I hope we have been able to provide you high quality care today.  You may receive a patient satisfaction survey over the next few weeks. We would appreciate your feedback and comments so that we may continue to improve ourselves and the health of our patients.

## 2023-01-10 ENCOUNTER — Other Ambulatory Visit: Payer: Self-pay | Admitting: Adult Health

## 2023-01-11 ENCOUNTER — Other Ambulatory Visit: Payer: Self-pay | Admitting: Adult Health

## 2023-01-14 ENCOUNTER — Encounter: Payer: Self-pay | Admitting: Adult Health

## 2023-01-18 DIAGNOSIS — M5451 Vertebrogenic low back pain: Secondary | ICD-10-CM | POA: Diagnosis not present

## 2023-01-21 DIAGNOSIS — M65341 Trigger finger, right ring finger: Secondary | ICD-10-CM | POA: Diagnosis not present

## 2023-01-21 DIAGNOSIS — M79641 Pain in right hand: Secondary | ICD-10-CM | POA: Diagnosis not present

## 2023-01-23 IMAGING — CR DG LUMBAR SPINE 2-3V
3 series · 3 of 3 positions shown · non-contrast
Comparison: None Available.

CLINICAL DATA: Lumbar decompression with discectomy.

EXAM:
LUMBAR SPINE - 2-3 VIEW

[lateral (1 of 3)]
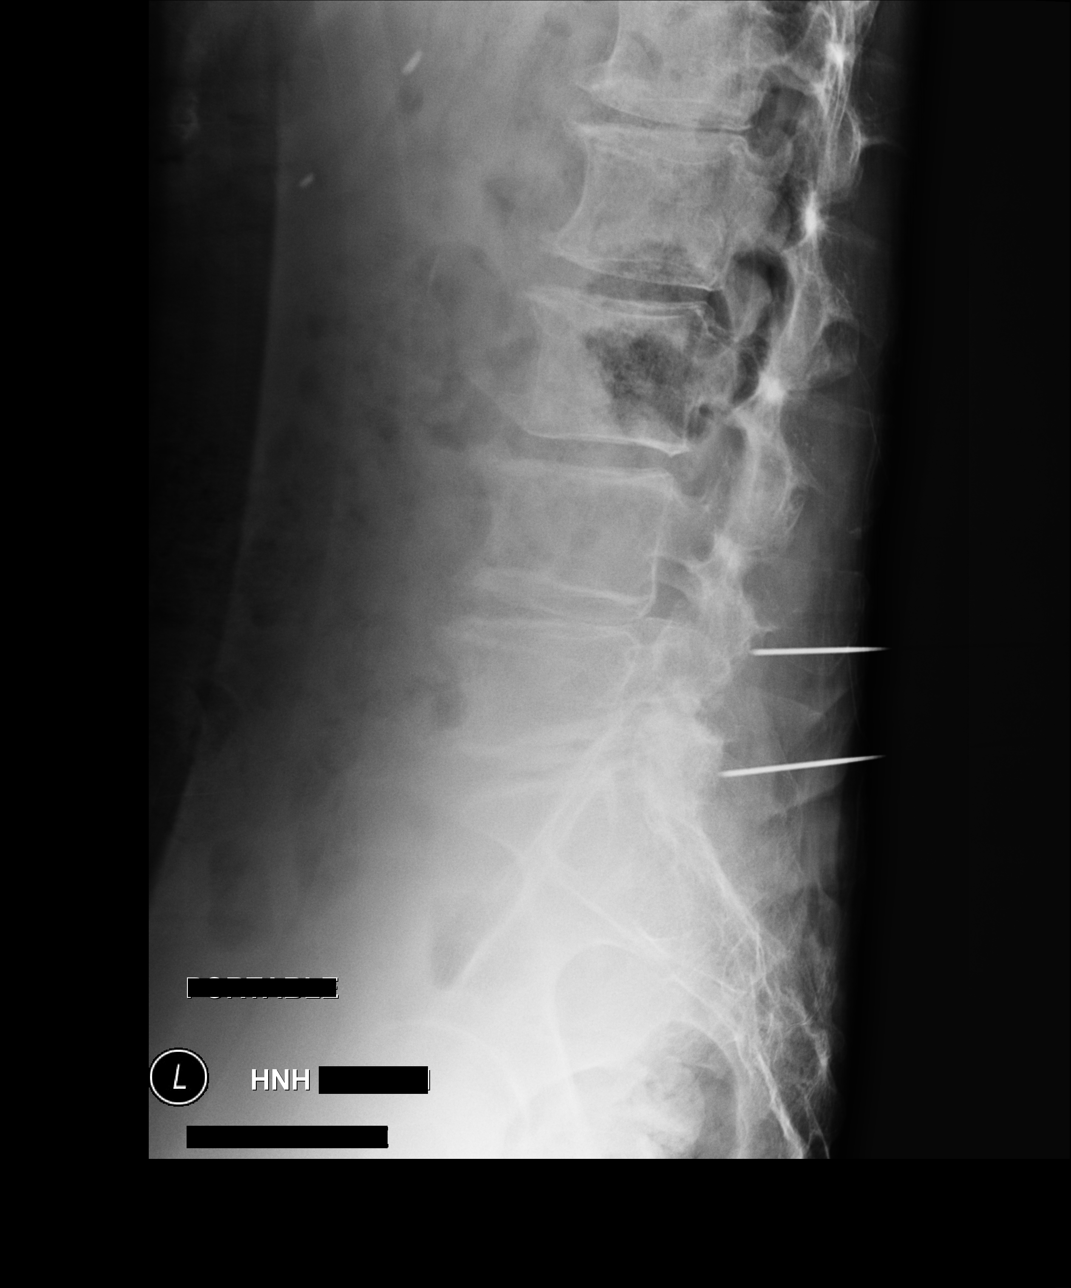

[lateral (2 of 3)]
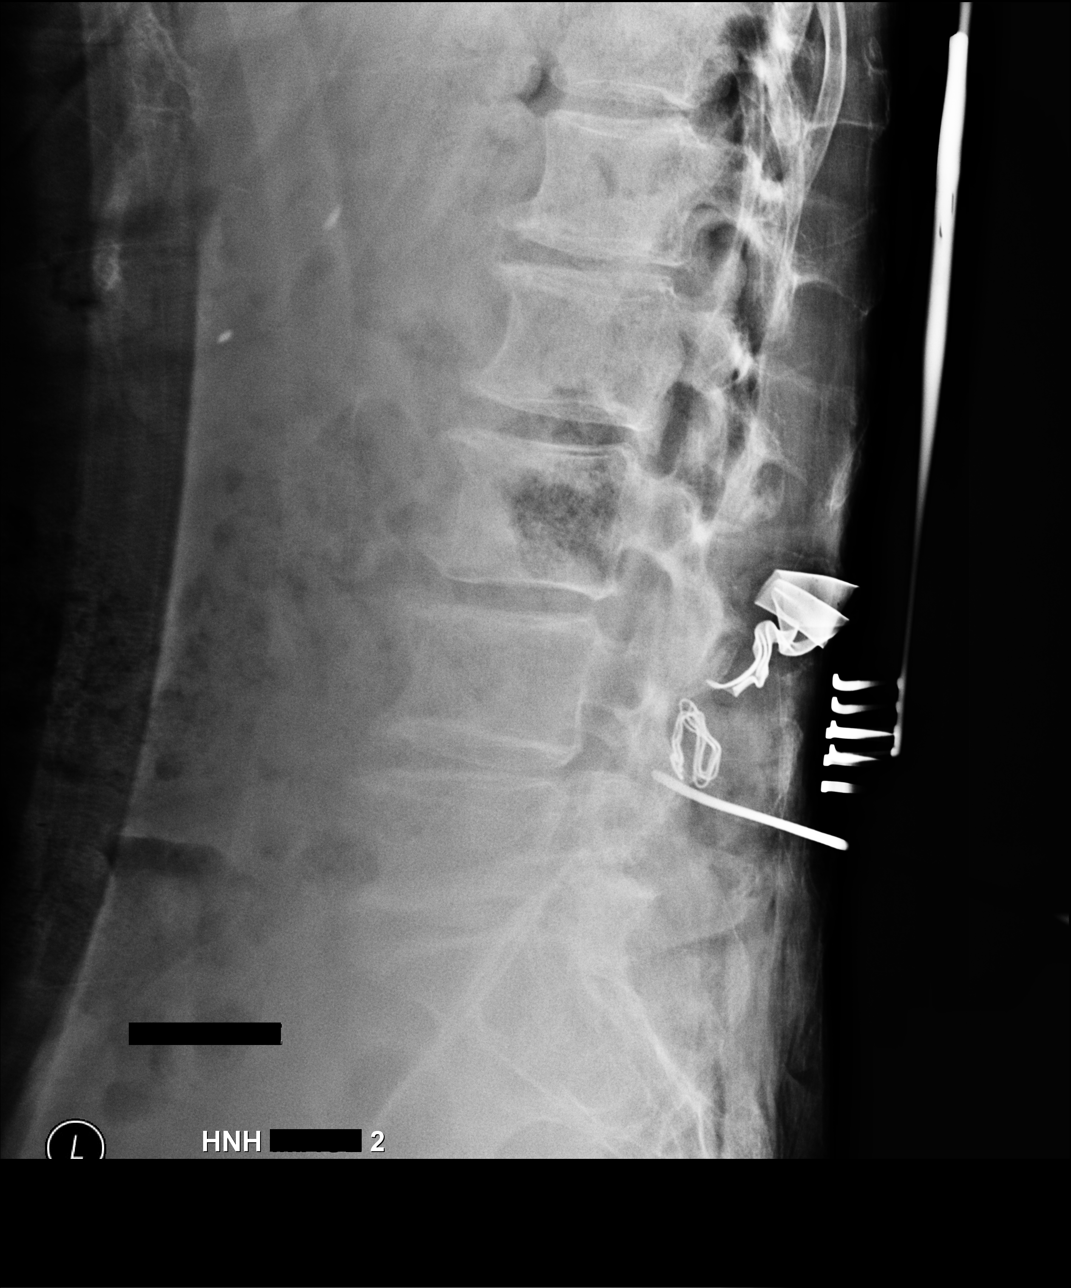

[lateral (3 of 3)]
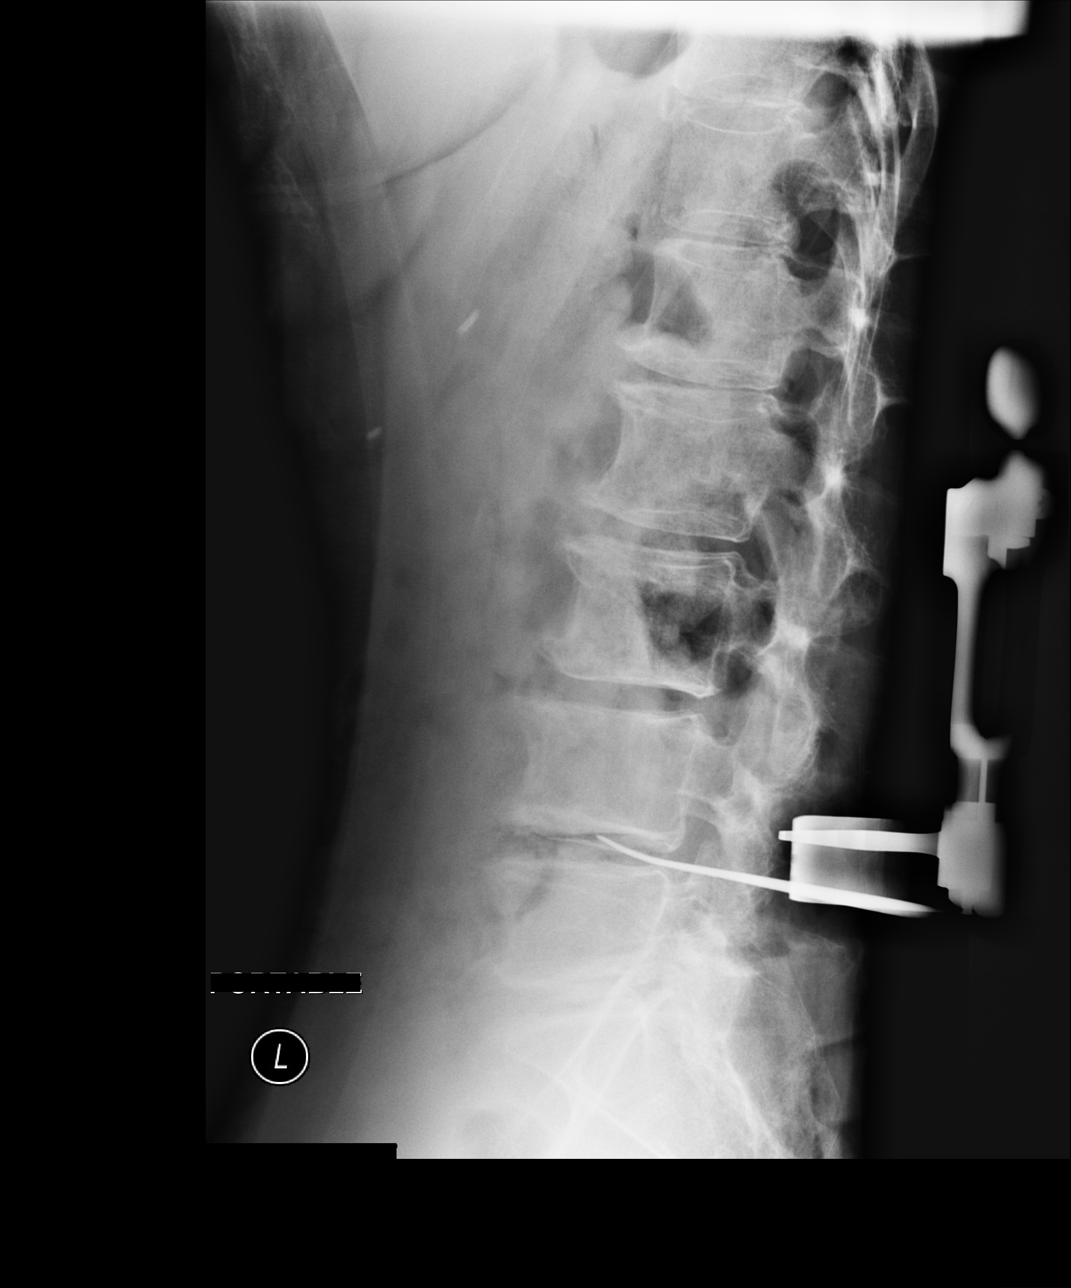

[3 of 3 positions shown; findings below may reference images not displayed]

FINDINGS: Image number 1 demonstrates localizer needles posterior spinal canal
at the L4-5 and L5-S1 levels.

Image number 2 reveals a surgical instrument posterior to the spinal
canal the L4-5 level.
IMPRESSION: L4-5 localized in the operating room.

These results were called by telephone at the time of interpretation
on 11/06/2021 at [DATE] to provider Emelyne Atek, who verbally
acknowledged these results.

## 2023-01-26 DIAGNOSIS — K080 Exfoliation of teeth due to systemic causes: Secondary | ICD-10-CM | POA: Diagnosis not present

## 2023-02-09 DIAGNOSIS — H524 Presbyopia: Secondary | ICD-10-CM | POA: Diagnosis not present

## 2023-02-15 DIAGNOSIS — M545 Low back pain, unspecified: Secondary | ICD-10-CM | POA: Diagnosis not present

## 2023-02-25 DIAGNOSIS — G2581 Restless legs syndrome: Secondary | ICD-10-CM | POA: Diagnosis not present

## 2023-02-25 DIAGNOSIS — I129 Hypertensive chronic kidney disease with stage 1 through stage 4 chronic kidney disease, or unspecified chronic kidney disease: Secondary | ICD-10-CM | POA: Diagnosis not present

## 2023-02-25 DIAGNOSIS — E782 Mixed hyperlipidemia: Secondary | ICD-10-CM | POA: Diagnosis not present

## 2023-02-25 DIAGNOSIS — R519 Headache, unspecified: Secondary | ICD-10-CM | POA: Diagnosis not present

## 2023-02-25 DIAGNOSIS — F39 Unspecified mood [affective] disorder: Secondary | ICD-10-CM | POA: Diagnosis not present

## 2023-02-25 DIAGNOSIS — N183 Chronic kidney disease, stage 3 unspecified: Secondary | ICD-10-CM | POA: Diagnosis not present

## 2023-03-11 ENCOUNTER — Ambulatory Visit
Admission: RE | Admit: 2023-03-11 | Discharge: 2023-03-11 | Disposition: A | Discharge status: Home / Self Care | Payer: Medicare Other | Source: Ambulatory Visit | Attending: Internal Medicine | Admitting: Internal Medicine

## 2023-03-11 DIAGNOSIS — M8588 Other specified disorders of bone density and structure, other site: Secondary | ICD-10-CM | POA: Diagnosis not present

## 2023-03-11 DIAGNOSIS — M810 Age-related osteoporosis without current pathological fracture: Secondary | ICD-10-CM

## 2023-03-11 DIAGNOSIS — N958 Other specified menopausal and perimenopausal disorders: Secondary | ICD-10-CM | POA: Diagnosis not present

## 2023-03-11 DIAGNOSIS — E349 Endocrine disorder, unspecified: Secondary | ICD-10-CM | POA: Diagnosis not present

## 2023-03-11 DIAGNOSIS — Z90722 Acquired absence of ovaries, bilateral: Secondary | ICD-10-CM | POA: Diagnosis not present

## 2023-04-07 DIAGNOSIS — M5451 Vertebrogenic low back pain: Secondary | ICD-10-CM | POA: Diagnosis not present

## 2023-04-20 DIAGNOSIS — L814 Other melanin hyperpigmentation: Secondary | ICD-10-CM | POA: Diagnosis not present

## 2023-04-20 DIAGNOSIS — D225 Melanocytic nevi of trunk: Secondary | ICD-10-CM | POA: Diagnosis not present

## 2023-04-20 DIAGNOSIS — L218 Other seborrheic dermatitis: Secondary | ICD-10-CM | POA: Diagnosis not present

## 2023-04-20 DIAGNOSIS — L570 Actinic keratosis: Secondary | ICD-10-CM | POA: Diagnosis not present

## 2023-04-20 DIAGNOSIS — L821 Other seborrheic keratosis: Secondary | ICD-10-CM | POA: Diagnosis not present

## 2023-04-27 DIAGNOSIS — M5416 Radiculopathy, lumbar region: Secondary | ICD-10-CM | POA: Diagnosis not present

## 2023-05-10 ENCOUNTER — Ambulatory Visit: Payer: Medicare Other | Attending: Orthopedic Surgery

## 2023-05-10 ENCOUNTER — Other Ambulatory Visit: Payer: Self-pay

## 2023-05-10 DIAGNOSIS — M6281 Muscle weakness (generalized): Secondary | ICD-10-CM | POA: Insufficient documentation

## 2023-05-10 DIAGNOSIS — M25551 Pain in right hip: Secondary | ICD-10-CM | POA: Insufficient documentation

## 2023-05-10 DIAGNOSIS — M5459 Other low back pain: Secondary | ICD-10-CM | POA: Insufficient documentation

## 2023-05-10 NOTE — Therapy (Signed)
OUTPATIENT PHYSICAL THERAPY THORACOLUMBAR EVALUATION   Patient Name: Rachael Webster MRN: [id number] DOB:04/11/1946, 77 y.o., female Today's Date: 05/10/2023  END OF SESSION:  PT End of Session - 05/10/23 1148     Visit Number 1    Number of Visits 9    Date for PT Re-Evaluation 07/05/23    Authorization Type BCBS    PT Start Time 1100    PT Stop Time 1140    PT Time Calculation (min) 40 min    Activity Tolerance Patient tolerated treatment well    Behavior During Therapy WFL for tasks assessed/performed             Past Medical History:  Diagnosis Date   Abdominal pain    middle of abd, 1 time only   Adenomatous colon polyp    Depression    Fecal incontinence    metamucill helps   FH: stomach ulcer    2001/ per pt, personal hx of ulcer, no family   GERD (gastroesophageal reflux disease)    Headache, chronic daily    Hearing loss    Wears Bil hearing aids.   Hyperlipidemia    Hypertension    Internal hemorrhoids    Osteopenia    Post-operative nausea and vomiting 1986   only 1 time   Past Surgical History:  Procedure Laterality Date   ABDOMINAL HYSTERECTOMY  1989   BREAST BIOPSY Left 2016   BREAST BIOPSY Right 01/06/2018   x2   BREAST EXCISIONAL BIOPSY Right    CHOLECYSTECTOMY  2007   EXPLORATORY LAPAROTOMY  08/24/1999   exploratory laparotomy with Cheree Ditto patch closoure of perforated duodenal ulcer   LUMBAR LAMINECTOMY/DECOMPRESSION MICRODISCECTOMY N/A 11/06/2021   Procedure: Lumbar four to five decompression and discectomy;  Surgeon: Venita Lick, MD;  Location: [facility] OR;  Service: Orthopedics;  Laterality: N/A;  3 C-Bed   ROTATOR CUFF REPAIR  1998   right   TONSILLECTOMY  1956   TUBAL LIGATION  1976   WRIST SURGERY  11/26/2016   right wrist   Patient Active Problem List   Diagnosis Date Noted   Lumbar disc herniation 11/06/2021   Cervico-occipital neuralgia 12/27/2019   Insomnia 12/27/2019   Migraine without aura and without status  migrainosus, not intractable 12/27/2019   GERD 08/07/2008   DIVERTICULOSIS-COLON 08/07/2008   NAUSEA ALONE 08/07/2008   DYSPHAGIA 08/07/2008   History of colonic polyps 08/07/2008   HYPERLIPIDEMIA 11/07/2007   HYPERTENSION 11/07/2007   CHRONIC HEADACHES 11/07/2007    PCP: Maurice Small, MD  REFERRING PROVIDER: Venita Lick, MD  REFERRING DIAG: M54.51 (ICD-10-CM) - Vertebrogenic low back pain   Rationale for Evaluation and Treatment: Rehabilitation  THERAPY DIAG:  Pain in right hip  Other low back pain  Muscle weakness (generalized)  ONSET DATE: Chronic  SUBJECTIVE:  SUBJECTIVE STATEMENT: Pt presents to PT with reports of slight LBP that refers into lateral R LE. Pt noted decrease in her symptoms after lumbar surgery in 2023 but they have started to come back. The current symptoms are not to the degree that they were last year. Denies bowel/bladder changes or saddle anesthesia. Pain increases with prolonged standing.   PERTINENT HISTORY:  HTN, Lumbar Laminectomy/Decompression 2023  PAIN:  Are you having pain?  No: NPRS scale: 0/10 Worst: 5/10 Pain location: R lateral LE, lower back Pain description: deep ache Aggravating factors: prolonged standing, walking Relieving factors: rest  PRECAUTIONS: None  RED FLAGS: None   WEIGHT BEARING RESTRICTIONS: No  FALLS:  Has patient fallen in last 6 months? No  LIVING ENVIRONMENT: Lives with: lives with their family Lives in: House/apartment Stairs: None Has following equipment at home: None  OCCUPATION: Retired  PLOF: Independent  PATIENT GOALS: decrease pain in her R LE, improve her standing tolerance  OBJECTIVE:  Note: Objective measures were completed at Evaluation unless otherwise noted.  DIAGNOSTIC FINDINGS:  See  imaging  PATIENT SURVEYS:  FOTO: 43% function; 56% predicted  COGNITION: Overall cognitive status: Within functional limits for tasks assessed     SENSATION: WFL  MUSCLE LENGTH: Hamstrings: Right WFL deg; Left WFL deg Thomas test: Right (-) deg; Left (-) deg  POSTURE: rounded shoulders and forward head  PALPATION: TTP to R piriformis   LOWER EXTREMITY MMT:    MMT Right eval Left eval  Hip flexion 4/5 5/5  Hip extension    Hip abduction 4/5 5/5  Hip adduction    Hip internal rotation    Hip external rotation    Knee flexion    Knee extension    Ankle dorsiflexion    Ankle plantarflexion    Ankle inversion    Ankle eversion     (Blank rows = not tested)  SPECIAL TESTS:  FABER test: Positive and FAIR: Positive R LE  FUNCTIONAL TESTS:  30 Second Sit to Stand: 11 reps  GAIT: Distance walked: 44ft Assistive device utilized: None Level of assistance: Complete Independence Comments: decreased gait speed, flexed trunk  TREATMENT: [facility] Adult PT Treatment:                                                DATE: 05/10/2023 Therapeutic Exercise: S/L clamshell x 10 RTB R Supine piriformis stretch x 30" R Seated fig 4 stretch x 30" R  PATIENT EDUCATION:  Education details: eval findings, FOTO, HEP, POC Person educated: Patient Education method: Explanation, Demonstration, and Handouts Education comprehension: verbalized understanding and returned demonstration  HOME EXERCISE PROGRAM: Access Code: [email or web address] URL: [email or web address] Date: 05/10/2023 Prepared by: Edwinna Areola  Exercises - Clamshell with Resistance  - 1 x daily - 7 x weekly - 3 sets - 10 reps - red band hold - Supine Piriformis Stretch with Leg Straight  - 1 x daily - 7 x weekly - 2 reps - 30 sec hold - Seated Piriformis Stretch with Trunk Bend  - 1 x daily - 7 x weekly - 2 reps - 30 sec hold  ASSESSMENT:  CLINICAL IMPRESSION: Patient is a 77 y.o. F who was seen today for  physical therapy evaluation and treatment for chronic LBP with previous referral into R LE before laminectomy in 2023. Physical findings are consistent with MD impression as pt demonstrates  core/hip weakness as well as piriformis tightness and positive FAIR testing. FOTO score shows subjective decrease in functional ability below PLOF. Pt would benefit from skilled PT services working on improving strength and decreasing pain.   OBJECTIVE IMPAIRMENTS: decreased activity tolerance, decreased endurance, decreased mobility, decreased ROM, decreased strength, postural dysfunction, and pain   ACTIVITY LIMITATIONS: standing, squatting, stairs, transfers, and locomotion level  PARTICIPATION LIMITATIONS: meal prep, cleaning, shopping, community activity, occupation, and yard work  PERSONAL FACTORS: Time since onset of injury/illness/exacerbation and 1-2 comorbidities: HTN, Lumbar Laminectomy/Decompression 2023  are also affecting patient's functional outcome.   REHAB POTENTIAL: Good  CLINICAL DECISION MAKING: Stable/uncomplicated  EVALUATION COMPLEXITY: Low   GOALS: Goals reviewed with patient? No  SHORT TERM GOALS: Target date: 05/31/2023   Pt will be compliant and knowledgeable with initial HEP for improved comfort and carryover Baseline: initial HEP given  Goal status: INITIAL  2.  Pt will self report low back and R LE pain no greater than 3/10 for improved comfort and functional ability Baseline: 5/10 at worst Goal status: INITIAL   LONG TERM GOALS: Target date: 07/05/2023   Pt will improve FOTO function score to no less than 56% as proxy for functional improvement Baseline: 43% function Goal status: INITIAL   2.  Pt will self report low back and R LE pain no greater than 1/10 for improved comfort and functional ability Baseline: 5/10 at worst Goal status: INITIAL   3.  Pt will increase R hip MMT to no less than 5/5 for improved functional mobility and decreased pain Baseline: see  MMT chart  Goal status: INITIAL   4.  Pt will be able to stand for 30 minutes not limited by R LE pain to improve comfort and ability while cooking and performing other home ADL tasks Baseline: unable without pain Goal status: INITIAL   PLAN:  PT FREQUENCY: 1x/week  PT DURATION: 8 weeks  PLANNED INTERVENTIONS: 97164- PT Re-evaluation, 97110-Therapeutic exercises, 97530- Therapeutic activity, 97112- Neuromuscular re-education, 97535- Self Care, 16109- Manual therapy, [id number]- Gait training, [id number]- Aquatic Therapy, 97014- Electrical stimulation (unattended), [id number]- Electrical stimulation (manual), Dry Needling, Cryotherapy, and Moist heat  PLAN FOR NEXT SESSION: assess HEP response, hip strengthening   Eloy End, PT 05/10/2023, 12:01 PM

## 2023-05-27 ENCOUNTER — Ambulatory Visit: Payer: Medicare Other | Attending: Orthopedic Surgery

## 2023-05-27 DIAGNOSIS — M5459 Other low back pain: Secondary | ICD-10-CM | POA: Diagnosis not present

## 2023-05-27 DIAGNOSIS — M6281 Muscle weakness (generalized): Secondary | ICD-10-CM | POA: Insufficient documentation

## 2023-05-27 DIAGNOSIS — M25551 Pain in right hip: Secondary | ICD-10-CM | POA: Diagnosis not present

## 2023-05-27 NOTE — Therapy (Signed)
OUTPATIENT PHYSICAL THERAPY TREATMENT   Patient Name: Rachael Webster MRN: [id number] DOB:01/31/1946, 77 y.o., female Today's Date: 05/27/2023  END OF SESSION:  PT End of Session - 05/27/23 1152     Visit Number 2    Number of Visits 9    Date for PT Re-Evaluation 07/05/23    Authorization Type BCBS    PT Start Time 1145    PT Stop Time 1225    PT Time Calculation (min) 40 min    Activity Tolerance Patient tolerated treatment well    Behavior During Therapy WFL for tasks assessed/performed              Past Medical History:  Diagnosis Date   Abdominal pain    middle of abd, 1 time only   Adenomatous colon polyp    Depression    Fecal incontinence    metamucill helps   FH: stomach ulcer    2001/ per pt, personal hx of ulcer, no family   GERD (gastroesophageal reflux disease)    Headache, chronic daily    Hearing loss    Wears Bil hearing aids.   Hyperlipidemia    Hypertension    Internal hemorrhoids    Osteopenia    Post-operative nausea and vomiting 1986   only 1 time   Past Surgical History:  Procedure Laterality Date   ABDOMINAL HYSTERECTOMY  1989   BREAST BIOPSY Left 2016   BREAST BIOPSY Right 01/06/2018   x2   BREAST EXCISIONAL BIOPSY Right    CHOLECYSTECTOMY  2007   EXPLORATORY LAPAROTOMY  08/24/1999   exploratory laparotomy with Cheree Ditto patch closoure of perforated duodenal ulcer   LUMBAR LAMINECTOMY/DECOMPRESSION MICRODISCECTOMY N/A 11/06/2021   Procedure: Lumbar four to five decompression and discectomy;  Surgeon: Venita Lick, MD;  Location: [facility] OR;  Service: Orthopedics;  Laterality: N/A;  3 C-Bed   ROTATOR CUFF REPAIR  1998   right   TONSILLECTOMY  1956   TUBAL LIGATION  1976   WRIST SURGERY  11/26/2016   right wrist   Patient Active Problem List   Diagnosis Date Noted   Lumbar disc herniation 11/06/2021   Cervico-occipital neuralgia 12/27/2019   Insomnia 12/27/2019   Migraine without aura and without status migrainosus, not  intractable 12/27/2019   GERD 08/07/2008   DIVERTICULOSIS-COLON 08/07/2008   NAUSEA ALONE 08/07/2008   DYSPHAGIA 08/07/2008   History of colonic polyps 08/07/2008   HYPERLIPIDEMIA 11/07/2007   HYPERTENSION 11/07/2007   CHRONIC HEADACHES 11/07/2007    PCP: Maurice Small, MD  REFERRING PROVIDER: Venita Lick, MD  REFERRING DIAG: M54.51 (ICD-10-CM) - Vertebrogenic low back pain   Rationale for Evaluation and Treatment: Rehabilitation  THERAPY DIAG:  Pain in right hip  Other low back pain  ONSET DATE: Chronic  SUBJECTIVE:  SUBJECTIVE STATEMENT: Pt presents to PT with continued symptoms in R hip and R LE. Has been compliant with initial HEP but notes irritation in R hip with sidelying clamshell.    PERTINENT HISTORY:  HTN, Lumbar Laminectomy/Decompression 2023  PAIN:  Are you having pain?  No: NPRS scale: 0/10 Worst: 5/10 Pain location: R lateral LE, lower back Pain description: deep ache Aggravating factors: prolonged standing, walking Relieving factors: rest  PRECAUTIONS: None  RED FLAGS: None   WEIGHT BEARING RESTRICTIONS: No  FALLS:  Has patient fallen in last 6 months? No  LIVING ENVIRONMENT: Lives with: lives with their family Lives in: House/apartment Stairs: None Has following equipment at home: None  OCCUPATION: Retired  PLOF: Independent  PATIENT GOALS: decrease pain in her R LE, improve her standing tolerance  OBJECTIVE:  Note: Objective measures were completed at Evaluation unless otherwise noted.  DIAGNOSTIC FINDINGS:  See imaging  PATIENT SURVEYS:  FOTO: 43% function; 56% predicted  COGNITION: Overall cognitive status: Within functional limits for tasks assessed     SENSATION: WFL  MUSCLE LENGTH: Hamstrings: Right WFL deg; Left WFL  deg Thomas test: Right (-) deg; Left (-) deg  POSTURE: rounded shoulders and forward head  PALPATION: TTP to R piriformis   LOWER EXTREMITY MMT:    MMT Right eval Left eval  Hip flexion 4/5 5/5  Hip extension    Hip abduction 4/5 5/5  Hip adduction    Hip internal rotation    Hip external rotation    Knee flexion    Knee extension    Ankle dorsiflexion    Ankle plantarflexion    Ankle inversion    Ankle eversion     (Blank rows = not tested)  SPECIAL TESTS:  FABER test: Positive and FAIR: Positive R LE  FUNCTIONAL TESTS:  30 Second Sit to Stand: 11 reps  GAIT: Distance walked: 15ft Assistive device utilized: None Level of assistance: Complete Independence Comments: decreased gait speed, flexed trunk  TREATMENT: [facility] Adult PT Treatment:                                                DATE: 05/27/2023 Therapeutic Exercise: NuStep lvl 5 UE/LE x 5 min while taking subjective Seated sciaitc nerve glide x 10 each LTR x 10 Supine clamshell 2x15 GTB Bridge 2x10 - small range Supine ball squeeze 2x10 - 5" hold Seated fig 4 stretch 2x30" each Standing hip abd 2x10 each  [facility] Adult PT Treatment:                                                DATE: 05/10/2023 Therapeutic Exercise: S/L clamshell x 10 RTB R Supine piriformis stretch x 30" R Seated fig 4 stretch x 30" R  PATIENT EDUCATION:  Education details: eval findings, FOTO, HEP, POC Person educated: Patient Education method: Explanation, Demonstration, and Handouts Education comprehension: verbalized understanding and returned demonstration  HOME EXERCISE PROGRAM: Access Code: [email or web address] URL: [email or web address] Date: 05/27/2023 Prepared by: Edwinna Areola  Exercises - Supine Piriformis Stretch with Leg Straight  - 1 x daily - 7 x weekly - 2 reps - 30 sec hold - Seated Piriformis Stretch with Trunk Bend  - 1 x daily - 7 x weekly -  2 reps - 30 sec hold - Hooklying Clamshell with Resistance  - 1  x daily - 7 x weekly - 2 sets - 15 reps - green band hold - Standing Hip Abduction with Counter Support  - 1 x daily - 7 x weekly - 2 sets - 10 reps - Seated Sciatic Tensioner  - 1 x daily - 7 x weekly - 2 sets - 10 reps  ASSESSMENT:  CLINICAL IMPRESSION: Pt was able to complete all prescribed exercises with no adverse effect. Therapy today focused on improving core and hip strength in order to decrease pain and improving mobility. HEP updated for improving proximal hip strength and decreasing neural tension.   EVAL: Patient is a 77 y.o. F who was seen today for physical therapy evaluation and treatment for chronic LBP with previous referral into R LE before laminectomy in 2023. Physical findings are consistent with MD impression as pt demonstrates core/hip weakness as well as piriformis tightness and positive FAIR testing. FOTO score shows subjective decrease in functional ability below PLOF. Pt would benefit from skilled PT services working on improving strength and decreasing pain.   OBJECTIVE IMPAIRMENTS: decreased activity tolerance, decreased endurance, decreased mobility, decreased ROM, decreased strength, postural dysfunction, and pain   ACTIVITY LIMITATIONS: standing, squatting, stairs, transfers, and locomotion level  PARTICIPATION LIMITATIONS: meal prep, cleaning, shopping, community activity, occupation, and yard work  PERSONAL FACTORS: Time since onset of injury/illness/exacerbation and 1-2 comorbidities: HTN, Lumbar Laminectomy/Decompression 2023  are also affecting patient's functional outcome.   REHAB POTENTIAL: Good  CLINICAL DECISION MAKING: Stable/uncomplicated  EVALUATION COMPLEXITY: Low   GOALS: Goals reviewed with patient? No  SHORT TERM GOALS: Target date: 05/31/2023   Pt will be compliant and knowledgeable with initial HEP for improved comfort and carryover Baseline: initial HEP given  Goal status: INITIAL  2.  Pt will self report low back and R LE pain no  greater than 3/10 for improved comfort and functional ability Baseline: 5/10 at worst Goal status: INITIAL   LONG TERM GOALS: Target date: 07/05/2023   Pt will improve FOTO function score to no less than 56% as proxy for functional improvement Baseline: 43% function Goal status: INITIAL   2.  Pt will self report low back and R LE pain no greater than 1/10 for improved comfort and functional ability Baseline: 5/10 at worst Goal status: INITIAL   3.  Pt will increase R hip MMT to no less than 5/5 for improved functional mobility and decreased pain Baseline: see MMT chart  Goal status: INITIAL   4.  Pt will be able to stand for 30 minutes not limited by R LE pain to improve comfort and ability while cooking and performing other home ADL tasks Baseline: unable without pain Goal status: INITIAL   PLAN:  PT FREQUENCY: 1x/week  PT DURATION: 8 weeks  PLANNED INTERVENTIONS: 97164- PT Re-evaluation, 97110-Therapeutic exercises, 97530- Therapeutic activity, 97112- Neuromuscular re-education, 97535- Self Care, 19147- Manual therapy, [id number]- Gait training, [id number]- Aquatic Therapy, 97014- Electrical stimulation (unattended), [id number]- Electrical stimulation (manual), Dry Needling, Cryotherapy, and Moist heat  PLAN FOR NEXT SESSION: assess HEP response, hip strengthening   Eloy End, PT 05/27/2023, 1:57 PM

## 2023-05-27 NOTE — Patient Instructions (Signed)
Aquatic Therapy at [facility]-  What to Expect!  Where:   [facility] [address] [facility] [place], [place] [address] Rehab phone [phone]  NOTE:  You will receive an automated phone message reminding you of your appt and it will say the appointment is at the [address] [facility] [address] [facility] clinic.          How to Prepare: Please make sure you drink 8 ounces of water about one hour prior to your pool session A caregiver may attend if needed with the patient to help assist as needed. A caregiver can sit in the pool room on chair. Please arrive IN YOUR SUIT and 15 minutes prior to your appointment - this helps to avoid delays in starting your session. Please make sure to attend to any toileting needs prior to entering the pool Locker rooms for changing are provided.   There is direct access to the pool deck form the locker room.  You can lock your belongings in a locker with lock provided. Once on the pool deck your therapist will ask if you have signed the Patient  Consent and Assignment of Benefits form before beginning treatment Your therapist may take your blood pressure prior to, during and after your session if indicated We usually try and create a home exercise program based on activities we do in the pool.  Please be thinking about who might be able to assist you in the pool should you need to participate in an aquatic home exercise program at the time of discharge if you need assistance.  Some patients do not want to or do not have the ability to participate in an aquatic home program - this is not a barrier in any way to you participating in aquatic therapy as part of your current therapy plan! After Discharge from PT, you can continue using home program at  the [facility]/, there is a drop-in fee for $5 ($45 a month)or for 60 years  or older $4.00 ($40 a month for seniors ) or any local [employer] pool.  Memberships for purchase are  available for gym/pool at [facility]  IT IS VERY IMPORTANT THAT YOUR LAST VISIT BE IN THE CLINIC AT [address] AFTER YOUR LAST AQUATIC VISIT.  PLEASE MAKE SURE THAT YOU HAVE A [address]  APPOINTMENT SCHEDULED.   About the pool: Pool is located approximately 500 FT from the entrance of the building.  Please bring a support person if you need assistance traveling this      distance.   Your therapist will assist you in entering the water; there are two ways to           enter: stairs with railings, and a mechanical lift. Your therapist will determine the most appropriate way for you.  Water temperature is usually between 88-90 degrees  There may be up to 2 other swimmers in the pool at the same time  The pool deck is tile, please wear shoes with good traction if you prefer not to be barefoot.    Contact Info:  For appointment scheduling and cancellations:         Please call the [facility]  PH:[phone]              Aquatic Therapy  [facility]patient [facility]       All sessions are 45 minutes                                                    

## 2023-05-28 ENCOUNTER — Ambulatory Visit: Payer: Medicare Other

## 2023-05-28 DIAGNOSIS — M5459 Other low back pain: Secondary | ICD-10-CM | POA: Diagnosis not present

## 2023-05-28 DIAGNOSIS — M25551 Pain in right hip: Secondary | ICD-10-CM

## 2023-05-28 DIAGNOSIS — M6281 Muscle weakness (generalized): Secondary | ICD-10-CM

## 2023-05-28 NOTE — Therapy (Signed)
OUTPATIENT PHYSICAL THERAPY TREATMENT   Patient Name: Rachael Webster MRN: [id number] DOB:[birth year], 77 y.o., female Today's Date: 05/28/2023  END OF SESSION:  PT End of Session - 05/28/23 1216     Visit Number 3    Number of Visits 9    Date for PT Re-Evaluation 07/05/23    Authorization Type BCBS    PT Start Time 1217    PT Stop Time 1300    PT Time Calculation (min) 43 min    Activity Tolerance Patient tolerated treatment well    Behavior During Therapy WFL for tasks assessed/performed             Past Medical History:  Diagnosis Date   Abdominal pain    middle of abd, 1 time only   Adenomatous colon polyp    Depression    Fecal incontinence    metamucill helps   FH: stomach ulcer    2001/ per pt, personal hx of ulcer, no family   GERD (gastroesophageal reflux disease)    Headache, chronic daily    Hearing loss    Wears Bil hearing aids.   Hyperlipidemia    Hypertension    Internal hemorrhoids    Osteopenia    Post-operative nausea and vomiting 1986   only 1 time   Past Surgical History:  Procedure Laterality Date   ABDOMINAL HYSTERECTOMY  1989   BREAST BIOPSY Left 2016   BREAST BIOPSY Right 01/06/2018   x2   BREAST EXCISIONAL BIOPSY Right    CHOLECYSTECTOMY  2007   EXPLORATORY LAPAROTOMY  08/24/1999   exploratory laparotomy with Cheree Ditto patch closoure of perforated duodenal ulcer   LUMBAR LAMINECTOMY/DECOMPRESSION MICRODISCECTOMY N/A 11/06/2021   Procedure: Lumbar four to five decompression and discectomy;  Surgeon: Venita Lick, MD;  Location: [facility] OR;  Service: Orthopedics;  Laterality: N/A;  3 C-Bed   ROTATOR CUFF REPAIR  1998   right   TONSILLECTOMY  1956   TUBAL LIGATION  1976   WRIST SURGERY  11/26/2016   right wrist   Patient Active Problem List   Diagnosis Date Noted   Lumbar disc herniation 11/06/2021   Cervico-occipital neuralgia 12/27/2019   Insomnia 12/27/2019   Migraine without aura and without status migrainosus, not  intractable 12/27/2019   GERD 08/07/2008   DIVERTICULOSIS-COLON 08/07/2008   NAUSEA ALONE 08/07/2008   DYSPHAGIA 08/07/2008   History of colonic polyps 08/07/2008   HYPERLIPIDEMIA 11/07/2007   HYPERTENSION 11/07/2007   CHRONIC HEADACHES 11/07/2007    PCP: Maurice Small, MD  REFERRING PROVIDER: Venita Lick, MD  REFERRING DIAG: M54.51 (ICD-10-CM) - Vertebrogenic low back pain   Rationale for Evaluation and Treatment: Rehabilitation  THERAPY DIAG:  Pain in right hip  Other low back pain  Muscle weakness (generalized)  ONSET DATE: Chronic  SUBJECTIVE:  SUBJECTIVE STATEMENT: Patient reports that she had some soreness in her hip and calf after her session yesterday on land.  PERTINENT HISTORY:  HTN, Lumbar Laminectomy/Decompression 2023  PAIN:  Are you having pain?  No: NPRS scale: 0/10 Worst: 5/10 Pain location: R lateral LE, lower back Pain description: deep ache Aggravating factors: prolonged standing, walking Relieving factors: rest  PRECAUTIONS: None  RED FLAGS: None   WEIGHT BEARING RESTRICTIONS: No  FALLS:  Has patient fallen in last 6 months? No  LIVING ENVIRONMENT: Lives with: lives with their family Lives in: House/apartment Stairs: None Has following equipment at home: None  OCCUPATION: Retired  PLOF: Independent  PATIENT GOALS: decrease pain in her R LE, improve her standing tolerance  OBJECTIVE:  Note: Objective measures were completed at Evaluation unless otherwise noted.  DIAGNOSTIC FINDINGS:  See imaging  PATIENT SURVEYS:  FOTO: 43% function; 56% predicted  COGNITION: Overall cognitive status: Within functional limits for tasks assessed     SENSATION: WFL  MUSCLE LENGTH: Hamstrings: Right WFL deg; Left WFL deg Thomas test: Right (-) deg;  Left (-) deg  POSTURE: rounded shoulders and forward head  PALPATION: TTP to R piriformis   LOWER EXTREMITY MMT:    MMT Right eval Left eval  Hip flexion 4/5 5/5  Hip extension    Hip abduction 4/5 5/5  Hip adduction    Hip internal rotation    Hip external rotation    Knee flexion    Knee extension    Ankle dorsiflexion    Ankle plantarflexion    Ankle inversion    Ankle eversion     (Blank rows = not tested)  SPECIAL TESTS:  FABER test: Positive and FAIR: Positive R LE  FUNCTIONAL TESTS:  30 Second Sit to Stand: 11 reps  GAIT: Distance walked: 21ft Assistive device utilized: None Level of assistance: Complete Independence Comments: decreased gait speed, flexed trunk  TREATMENT: [facility] Adult PT Treatment:                                                DATE: 05/28/23 Aquatic therapy at [facility]- [address] - therapeutic pool temp approximately 92 degrees. Pt enters building ambulating independently. Treatment took place in water 3.8 to  4 ft 8 in. deep depending upon activity.  Pt entered and exited the pool via stair and handrails independently. Patient entered water for aquatic therapy for first time and was introduced to principles and therapeutic effects of water as they ambulated and acclimated to pool.  Therapeutic Exercise: Aquatic Exercise: Walking forward/side stepping holding white barbell Standing thoracic rotation with white barbell x1' Step ups on bottom step fwd/lat x10 ea BIL Standing with UE support edge of pool: Hip abd/add x20 BIL Hip ext/flex with knee straight x 20 BIL Hip Circles CC/CCW x10 each BIL Marching hip flexion to knee extension 2x10 BIL Hamstring curl x20 BIL Heel raises x20 Squats x20  Pt requires the buoyancy of water for active assisted exercises with buoyancy supported for strengthening and AROM exercises. Hydrostatic pressure also supports joints by unweighting joint load by at least 50 % in 3-4 feet depth water.  80% in chest to neck deep water. Water will provide assistance with movement using the current and laminar flow while the buoyancy reduces weight bearing. Pt requires the viscosity of the water for resistance with strengthening exercises.  [facility] Adult PT Treatment:                                                DATE: 05/27/2023 Therapeutic Exercise: NuStep lvl 5 UE/LE x 5 min while taking subjective Seated sciaitc nerve glide x 10 each LTR x 10 Supine clamshell 2x15 GTB Bridge 2x10 - small range Supine ball squeeze 2x10 - 5" hold Seated fig 4 stretch 2x30" each Standing hip abd 2x10 each  [facility] Adult PT Treatment:                                                DATE: 05/10/2023 Therapeutic Exercise: S/L clamshell x 10 RTB R Supine piriformis stretch x 30" R Seated fig 4 stretch x 30" R  PATIENT EDUCATION:  Education details: eval findings, FOTO, HEP, POC Person educated: Patient Education method: Explanation, Demonstration, and Handouts Education comprehension: verbalized understanding and returned demonstration  HOME EXERCISE PROGRAM: Access Code: [email or web address] URL: [email or web address] Date: 05/27/2023 Prepared by: Edwinna Areola  Exercises - Supine Piriformis Stretch with Leg Straight  - 1 x daily - 7 x weekly - 2 reps - 30 sec hold - Seated Piriformis Stretch with Trunk Bend  - 1 x daily - 7 x weekly - 2 reps - 30 sec hold - Hooklying Clamshell with Resistance  - 1 x daily - 7 x weekly - 2 sets - 15 reps - green band hold - Standing Hip Abduction with Counter Support  - 1 x daily - 7 x weekly - 2 sets - 10 reps - Seated Sciatic Tensioner  - 1 x daily - 7 x weekly - 2 sets - 10 reps  ASSESSMENT:  CLINICAL IMPRESSION: Patient presents to first aquatic PT reporting some soreness in her Rt hip and calf from yesterday's land session. Session today focused on proximal hip and LE strengthening as well as improving standing activity tolerance in the aquatic environment for  use of buoyancy to offload joints and the viscosity of water as resistance during therapeutic exercise. Patient was able to tolerate all prescribed exercises in the aquatic environment with no adverse effects. Patient continues to benefit from skilled PT services on land and aquatic based and should be progressed as able to improve functional independence.    EVAL: Patient is a 77 y.o. F who was seen today for physical therapy evaluation and treatment for chronic LBP with previous referral into R LE before laminectomy in 2023. Physical findings are consistent with MD impression as pt demonstrates core/hip weakness as well as piriformis tightness and positive FAIR testing. FOTO score shows subjective decrease in functional ability below PLOF. Pt would benefit from skilled PT services working on improving strength and decreasing pain.   OBJECTIVE IMPAIRMENTS: decreased activity tolerance, decreased endurance, decreased mobility, decreased ROM, decreased strength, postural dysfunction, and pain   ACTIVITY LIMITATIONS: standing, squatting, stairs, transfers, and locomotion level  PARTICIPATION LIMITATIONS: meal prep, cleaning, shopping, community activity, occupation, and yard work  PERSONAL FACTORS: Time since onset of injury/illness/exacerbation and 1-2 comorbidities: HTN, Lumbar Laminectomy/Decompression 2023  are also affecting patient's functional outcome.   REHAB POTENTIAL: Good  CLINICAL DECISION MAKING: Stable/uncomplicated  EVALUATION COMPLEXITY: Low   GOALS: Goals reviewed with patient? No  SHORT  TERM GOALS: Target date: 05/31/2023   Pt will be compliant and knowledgeable with initial HEP for improved comfort and carryover Baseline: initial HEP given  Goal status: INITIAL  2.  Pt will self report low back and R LE pain no greater than 3/10 for improved comfort and functional ability Baseline: 5/10 at worst Goal status: INITIAL   LONG TERM GOALS: Target date: 07/05/2023   Pt  will improve FOTO function score to no less than 56% as proxy for functional improvement Baseline: 43% function Goal status: INITIAL   2.  Pt will self report low back and R LE pain no greater than 1/10 for improved comfort and functional ability Baseline: 5/10 at worst Goal status: INITIAL   3.  Pt will increase R hip MMT to no less than 5/5 for improved functional mobility and decreased pain Baseline: see MMT chart  Goal status: INITIAL   4.  Pt will be able to stand for 30 minutes not limited by R LE pain to improve comfort and ability while cooking and performing other home ADL tasks Baseline: unable without pain Goal status: INITIAL   PLAN:  PT FREQUENCY: 1x/week  PT DURATION: 8 weeks  PLANNED INTERVENTIONS: 97164- PT Re-evaluation, 97110-Therapeutic exercises, 97530- Therapeutic activity, 97112- Neuromuscular re-education, 97535- Self Care, 16109- Manual therapy, [id number]- Gait training, [id number]- Aquatic Therapy, 97014- Electrical stimulation (unattended), [id number]- Electrical stimulation (manual), Dry Needling, Cryotherapy, and Moist heat  PLAN FOR NEXT SESSION: assess HEP response, hip strengthening   Berta Minor, PTA 05/28/2023, 1:02 PM

## 2023-06-03 ENCOUNTER — Ambulatory Visit: Payer: Medicare Other

## 2023-06-03 DIAGNOSIS — M5459 Other low back pain: Secondary | ICD-10-CM | POA: Diagnosis not present

## 2023-06-03 DIAGNOSIS — M25551 Pain in right hip: Secondary | ICD-10-CM | POA: Diagnosis not present

## 2023-06-03 DIAGNOSIS — M6281 Muscle weakness (generalized): Secondary | ICD-10-CM | POA: Diagnosis not present

## 2023-06-03 NOTE — Therapy (Signed)
OUTPATIENT PHYSICAL THERAPY TREATMENT   Patient Name: Rachael Webster MRN: [id number] DOB:June 03, 1946, 77 y.o., female Today's Date: 06/04/2023  END OF SESSION:  PT End of Session - 06/03/23 1406     Visit Number 4    Number of Visits 9    Date for PT Re-Evaluation 07/05/23    Authorization Type BCBS    PT Start Time 1402    PT Stop Time 1441    PT Time Calculation (min) 39 min    Activity Tolerance Patient tolerated treatment well    Behavior During Therapy WFL for tasks assessed/performed              Past Medical History:  Diagnosis Date   Abdominal pain    middle of abd, 1 time only   Adenomatous colon polyp    Depression    Fecal incontinence    metamucill helps   FH: stomach ulcer    2001/ per pt, personal hx of ulcer, no family   GERD (gastroesophageal reflux disease)    Headache, chronic daily    Hearing loss    Wears Bil hearing aids.   Hyperlipidemia    Hypertension    Internal hemorrhoids    Osteopenia    Post-operative nausea and vomiting 1986   only 1 time   Past Surgical History:  Procedure Laterality Date   ABDOMINAL HYSTERECTOMY  1989   BREAST BIOPSY Left 2016   BREAST BIOPSY Right 01/06/2018   x2   BREAST EXCISIONAL BIOPSY Right    CHOLECYSTECTOMY  2007   EXPLORATORY LAPAROTOMY  08/24/1999   exploratory laparotomy with Cheree Ditto patch closoure of perforated duodenal ulcer   LUMBAR LAMINECTOMY/DECOMPRESSION MICRODISCECTOMY N/A 11/06/2021   Procedure: Lumbar four to five decompression and discectomy;  Surgeon: Venita Lick, MD;  Location: [facility] OR;  Service: Orthopedics;  Laterality: N/A;  3 C-Bed   ROTATOR CUFF REPAIR  1998   right   TONSILLECTOMY  1956   TUBAL LIGATION  1976   WRIST SURGERY  11/26/2016   right wrist   Patient Active Problem List   Diagnosis Date Noted   Lumbar disc herniation 11/06/2021   Cervico-occipital neuralgia 12/27/2019   Insomnia 12/27/2019   Migraine without aura and without status migrainosus,  not intractable 12/27/2019   GERD 08/07/2008   DIVERTICULOSIS-COLON 08/07/2008   NAUSEA ALONE 08/07/2008   DYSPHAGIA 08/07/2008   History of colonic polyps 08/07/2008   HYPERLIPIDEMIA 11/07/2007   HYPERTENSION 11/07/2007   CHRONIC HEADACHES 11/07/2007    PCP: Maurice Small, MD  REFERRING PROVIDER: Venita Lick, MD  REFERRING DIAG: M54.51 (ICD-10-CM) - Vertebrogenic low back pain   Rationale for Evaluation and Treatment: Rehabilitation  THERAPY DIAG:  Pain in right hip  Other low back pain  ONSET DATE: Chronic  SUBJECTIVE:  SUBJECTIVE STATEMENT: Pt presents to PT with reports of increased pain in lower back and R hip after two sessions last week. It dissipated after a few days. She enjoyed aquatic session and would like to move appointments around to do this more. Notes continues back and R LE pain.   PERTINENT HISTORY:  HTN, Lumbar Laminectomy/Decompression 2023  PAIN:  Are you having pain?  No: NPRS scale: 3/10 Worst: 5/10 Pain location: R lateral LE, lower back Pain description: deep ache Aggravating factors: prolonged standing, walking Relieving factors: rest  PRECAUTIONS: None  RED FLAGS: None   WEIGHT BEARING RESTRICTIONS: No  FALLS:  Has patient fallen in last 6 months? No  LIVING ENVIRONMENT: Lives with: lives with their family Lives in: House/apartment Stairs: None Has following equipment at home: None  OCCUPATION: Retired  PLOF: Independent  PATIENT GOALS: decrease pain in her R LE, improve her standing tolerance  OBJECTIVE:  Note: Objective measures were completed at Evaluation unless otherwise noted.  DIAGNOSTIC FINDINGS:  See imaging  PATIENT SURVEYS:  FOTO: 43% function; 56% predicted  COGNITION: Overall cognitive status: Within functional  limits for tasks assessed     SENSATION: WFL  MUSCLE LENGTH: Hamstrings: Right WFL deg; Left WFL deg Thomas test: Right (-) deg; Left (-) deg  POSTURE: rounded shoulders and forward head  PALPATION: TTP to R piriformis   LOWER EXTREMITY MMT:    MMT Right eval Left eval  Hip flexion 4/5 5/5  Hip extension    Hip abduction 4/5 5/5  Hip adduction    Hip internal rotation    Hip external rotation    Knee flexion    Knee extension    Ankle dorsiflexion    Ankle plantarflexion    Ankle inversion    Ankle eversion     (Blank rows = not tested)  SPECIAL TESTS:  FABER test: Positive and FAIR: Positive R LE  FUNCTIONAL TESTS:  30 Second Sit to Stand: 11 reps  GAIT: Distance walked: 24ft Assistive device utilized: None Level of assistance: Complete Independence Comments: decreased gait speed, flexed trunk  TREATMENT: [facility] Adult PT Treatment:                                                DATE: 06/03/2023 Therapeutic Exercise: NuStep lvl 5 UE/LE x 5 min while taking subjective LTR x 10 Supine clamshell 2x15 RTB Bridge 2x10 - small range Supine SLR 2x10 each Supine PPT x 10 - 5" hold Supine piriformis stretch 2x30" R Supine ball squeeze 2x10 - 5" hold Seated fig 4 stretch 2x30" each  [facility] Adult PT Treatment:                                                DATE: 05/28/23 Aquatic therapy at [facility]- [address] - therapeutic pool temp approximately 92 degrees. Pt enters building ambulating independently. Treatment took place in water 3.8 to  4 ft 8 in. deep depending upon activity.  Pt entered and exited the pool via stair and handrails independently. Patient entered water for aquatic therapy for first time and was introduced to principles and therapeutic effects of water as they ambulated and acclimated to pool.  Therapeutic Exercise: Aquatic Exercise: Walking forward/side stepping  holding white barbell Standing thoracic rotation with white barbell  x1' Step ups on bottom step fwd/lat x10 ea BIL Standing with UE support edge of pool: Hip abd/add x20 BIL Hip ext/flex with knee straight x 20 BIL Hip Circles CC/CCW x10 each BIL Marching hip flexion to knee extension 2x10 BIL Hamstring curl x20 BIL Heel raises x20 Squats x20  Pt requires the buoyancy of water for active assisted exercises with buoyancy supported for strengthening and AROM exercises. Hydrostatic pressure also supports joints by unweighting joint load by at least 50 % in 3-4 feet depth water. 80% in chest to neck deep water. Water will provide assistance with movement using the current and laminar flow while the buoyancy reduces weight bearing. Pt requires the viscosity of the water for resistance with strengthening exercises.  [facility] Adult PT Treatment:                                                DATE: 05/27/2023 Therapeutic Exercise: NuStep lvl 5 UE/LE x 5 min while taking subjective Seated sciaitc nerve glide x 10 each LTR x 10 Supine clamshell 2x15 GTB Bridge 2x10 - small range Supine ball squeeze 2x10 - 5" hold Seated fig 4 stretch 2x30" each Standing hip abd 2x10 each  [facility] Adult PT Treatment:                                                DATE: 05/10/2023 Therapeutic Exercise: S/L clamshell x 10 RTB R Supine piriformis stretch x 30" R Seated fig 4 stretch x 30" R  PATIENT EDUCATION:  Education details: eval findings, FOTO, HEP, POC Person educated: Patient Education method: Explanation, Demonstration, and Handouts Education comprehension: verbalized understanding and returned demonstration  HOME EXERCISE PROGRAM: Access Code: [email or web address] URL: [email or web address] Date: 05/27/2023 Prepared by: Edwinna Areola  Exercises - Supine Piriformis Stretch with Leg Straight  - 1 x daily - 7 x weekly - 2 reps - 30 sec hold - Seated Piriformis Stretch with Trunk Bend  - 1 x daily - 7 x weekly - 2 reps - 30 sec hold - Hooklying Clamshell with Resistance   - 1 x daily - 7 x weekly - 2 sets - 15 reps - green band hold - Standing Hip Abduction with Counter Support  - 1 x daily - 7 x weekly - 2 sets - 10 reps - Seated Sciatic Tensioner  - 1 x daily - 7 x weekly - 2 sets - 10 reps  ASSESSMENT:  CLINICAL IMPRESSION: Pt tolerated treatment fair with no adverse effect. Therapy today continued to focus on core and hip strengthening for decreasing pain. HEP updated and aquatic HEP given today for eventual discharge plan to [facility]. Will continue per current plan.    EVAL: Patient is a 77 y.o. F who was seen today for physical therapy evaluation and treatment for chronic LBP with previous referral into R LE before laminectomy in 2023. Physical findings are consistent with MD impression as pt demonstrates core/hip weakness as well as piriformis tightness and positive FAIR testing. FOTO score shows subjective decrease in functional ability below PLOF. Pt would benefit from skilled PT services working on improving strength and decreasing pain.   OBJECTIVE IMPAIRMENTS: decreased activity tolerance,  decreased endurance, decreased mobility, decreased ROM, decreased strength, postural dysfunction, and pain   ACTIVITY LIMITATIONS: standing, squatting, stairs, transfers, and locomotion level  PARTICIPATION LIMITATIONS: meal prep, cleaning, shopping, community activity, occupation, and yard work  PERSONAL FACTORS: Time since onset of injury/illness/exacerbation and 1-2 comorbidities: HTN, Lumbar Laminectomy/Decompression 2023  are also affecting patient's functional outcome.   REHAB POTENTIAL: Good  CLINICAL DECISION MAKING: Stable/uncomplicated  EVALUATION COMPLEXITY: Low   GOALS: Goals reviewed with patient? No  SHORT TERM GOALS: Target date: 05/31/2023   Pt will be compliant and knowledgeable with initial HEP for improved comfort and carryover Baseline: initial HEP given  Goal status: INITIAL  2.  Pt will self report low back and R LE pain no greater  than 3/10 for improved comfort and functional ability Baseline: 5/10 at worst Goal status: INITIAL   LONG TERM GOALS: Target date: 07/05/2023   Pt will improve FOTO function score to no less than 56% as proxy for functional improvement Baseline: 43% function Goal status: INITIAL   2.  Pt will self report low back and R LE pain no greater than 1/10 for improved comfort and functional ability Baseline: 5/10 at worst Goal status: INITIAL   3.  Pt will increase R hip MMT to no less than 5/5 for improved functional mobility and decreased pain Baseline: see MMT chart  Goal status: INITIAL   4.  Pt will be able to stand for 30 minutes not limited by R LE pain to improve comfort and ability while cooking and performing other home ADL tasks Baseline: unable without pain Goal status: INITIAL   PLAN:  PT FREQUENCY: 1x/week  PT DURATION: 8 weeks  PLANNED INTERVENTIONS: 97164- PT Re-evaluation, 97110-Therapeutic exercises, 97530- Therapeutic activity, 97112- Neuromuscular re-education, 97535- Self Care, 16109- Manual therapy, [id number]- Gait training, [id number]- Aquatic Therapy, 97014- Electrical stimulation (unattended), [id number]- Electrical stimulation (manual), Dry Needling, Cryotherapy, and Moist heat  PLAN FOR NEXT SESSION: assess HEP response, hip strengthening   Eloy End, PT 06/04/2023, 7:52 AM

## 2023-06-10 ENCOUNTER — Ambulatory Visit: Payer: Medicare Other

## 2023-06-10 DIAGNOSIS — M5459 Other low back pain: Secondary | ICD-10-CM | POA: Diagnosis not present

## 2023-06-10 DIAGNOSIS — M25551 Pain in right hip: Secondary | ICD-10-CM

## 2023-06-10 DIAGNOSIS — M6281 Muscle weakness (generalized): Secondary | ICD-10-CM | POA: Diagnosis not present

## 2023-06-10 NOTE — Therapy (Addendum)
 OUTPATIENT PHYSICAL THERAPY TREATMENT NOTE/DISCHARGE  PHYSICAL THERAPY DISCHARGE SUMMARY  Visits from Start of Care: 5  Current functional level related to goals / functional outcomes: See goals/objective   Remaining deficits: Unable to assess   Education / Equipment: HEP   Patient agrees to discharge. Patient goals were unable to assess. Patient is being discharged due to not returning since the last visit.     Patient Name: Rachael Webster MRN: [id number] DOB:October 03, 1945, 77 y.o., female Today's Date: 06/10/2023  END OF SESSION:  PT End of Session - 06/10/23 1058     Visit Number 5    Number of Visits 9    Date for PT Re-Evaluation 07/05/23    Authorization Type BCBS    PT Start Time 1100    PT Stop Time 1140    PT Time Calculation (min) 40 min    Activity Tolerance Patient tolerated treatment well    Behavior During Therapy WFL for tasks assessed/performed               Past Medical History:  Diagnosis Date   Abdominal pain    middle of abd, 1 time only   Adenomatous colon polyp    Depression    Fecal incontinence    metamucill helps   FH: stomach ulcer    2001/ per pt, personal hx of ulcer, no family   GERD (gastroesophageal reflux disease)    Headache, chronic daily    Hearing loss    Wears Bil hearing aids.   Hyperlipidemia    Hypertension    Internal hemorrhoids    Osteopenia    Post-operative nausea and vomiting 1986   only 1 time   Past Surgical History:  Procedure Laterality Date   ABDOMINAL HYSTERECTOMY  1989   BREAST BIOPSY Left 2016   BREAST BIOPSY Right 01/06/2018   x2   BREAST EXCISIONAL BIOPSY Right    CHOLECYSTECTOMY  2007   EXPLORATORY LAPAROTOMY  08/24/1999   exploratory laparotomy with Arlyss patch closoure of perforated duodenal ulcer   LUMBAR LAMINECTOMY/DECOMPRESSION MICRODISCECTOMY N/A 11/06/2021   Procedure: Lumbar four to five decompression and discectomy;  Surgeon: Burnetta Aures, MD;  Location: [facility] OR;   Service: Orthopedics;  Laterality: N/A;  3 C-Bed   ROTATOR CUFF REPAIR  1998   right   TONSILLECTOMY  1956   TUBAL LIGATION  1976   WRIST SURGERY  11/26/2016   right wrist   Patient Active Problem List   Diagnosis Date Noted   Lumbar disc herniation 11/06/2021   Cervico-occipital neuralgia 12/27/2019   Insomnia 12/27/2019   Migraine without aura and without status migrainosus, not intractable 12/27/2019   GERD 08/07/2008   DIVERTICULOSIS-COLON 08/07/2008   NAUSEA ALONE 08/07/2008   DYSPHAGIA 08/07/2008   History of colonic polyps 08/07/2008   HYPERLIPIDEMIA 11/07/2007   HYPERTENSION 11/07/2007   CHRONIC HEADACHES 11/07/2007    PCP: Signa Dire, MD  REFERRING PROVIDER: Burnetta Aures, MD  REFERRING DIAG: M54.51 (ICD-10-CM) - Vertebrogenic low back pain   Rationale for Evaluation and Treatment: Rehabilitation  THERAPY DIAG:  Pain in right hip  Other low back pain  ONSET DATE: Chronic  SUBJECTIVE:  SUBJECTIVE STATEMENT: Pt presents to PT stating that she is feeling pretty good today. Notes that she was standing and talking to her neighbor for about 15 minutes yesterday and this caused her back to hurt. Has continued HEP compliance, feels it is getting less sore.   PERTINENT HISTORY:  HTN, Lumbar Laminectomy/Decompression 2023  PAIN:  Are you having pain?  No: NPRS scale: 3/10 Worst: 5/10 Pain location: R lateral LE, lower back Pain description: deep ache Aggravating factors: prolonged standing, walking Relieving factors: rest  PRECAUTIONS: None  RED FLAGS: None   WEIGHT BEARING RESTRICTIONS: No  FALLS:  Has patient fallen in last 6 months? No  LIVING ENVIRONMENT: Lives with: lives with their family Lives in: House/apartment Stairs: None Has following equipment at  home: None  OCCUPATION: Retired  PLOF: Independent  PATIENT GOALS: decrease pain in her R LE, improve her standing tolerance  OBJECTIVE:  Note: Objective measures were completed at Evaluation unless otherwise noted.  DIAGNOSTIC FINDINGS:  See imaging  PATIENT SURVEYS:  FOTO: 43% function; 56% predicted  COGNITION: Overall cognitive status: Within functional limits for tasks assessed     SENSATION: WFL  MUSCLE LENGTH: Hamstrings: Right WFL deg; Left WFL deg Thomas test: Right (-) deg; Left (-) deg  POSTURE: rounded shoulders and forward head  PALPATION: TTP to R piriformis   LOWER EXTREMITY MMT:    MMT Right eval Left eval  Hip flexion 4/5 5/5  Hip extension    Hip abduction 4/5 5/5  Hip adduction    Hip internal rotation    Hip external rotation    Knee flexion    Knee extension    Ankle dorsiflexion    Ankle plantarflexion    Ankle inversion    Ankle eversion     (Blank rows = not tested)  SPECIAL TESTS:  FABER test: Positive and FAIR: Positive R LE  FUNCTIONAL TESTS:  30 Second Sit to Stand: 11 reps  GAIT: Distance walked: 24ft Assistive device utilized: None Level of assistance: Complete Independence Comments: decreased gait speed, flexed trunk  TREATMENT: [facility] Adult PT Treatment:                                                DATE: 06/10/2023 Therapeutic Exercise: NuStep lvl 5 UE/LE x 5 min while taking subjective LTR x 10 SLR 2x10 each Supine PPT x 10  Supine PPT with ball 2x10  Supine clamshell 2x15 RTB Bridge 2x10 - small range Supine sciatic nerve glide x 15 each Supine piriformis stretch 2x30 R Seated hamstring stretch 2x30 STS - high table no UE x 10  PATIENT EDUCATION:  Education details: HEP update Person educated: Patient Education method: Explanation, Demonstration, and Handouts Education comprehension: verbalized understanding and returned demonstration  HOME EXERCISE PROGRAM: Access Code: [email or web address] URL:  [email or web address] Date: 05/27/2023 Prepared by: Alm Kingdom  Exercises - Supine Piriformis Stretch with Leg Straight  - 1 x daily - 7 x weekly - 2 reps - 30 sec hold - Seated Piriformis Stretch with Trunk Bend  - 1 x daily - 7 x weekly - 2 reps - 30 sec hold - Hooklying Clamshell with Resistance  - 1 x daily - 7 x weekly - 2 sets - 15 reps - green band hold - Standing Hip Abduction with Counter Support  - 1 x daily - 7 x weekly - 2  sets - 10 reps - Seated Sciatic Tensioner  - 1 x daily - 7 x weekly - 2 sets - 10 reps  ASSESSMENT:  CLINICAL IMPRESSION: Pt tolerated treatment better today and was able to complete all prescribed exercises with no adverse effect. Therapy today focused on improving core and proximal hip strength as well as muscle length and decreasing neural tension. Her HEP was updated for continued posterior chain stretching. Will continue with aquatic therapy and then assess progress at next session in gym.    EVAL: Patient is a 77 y.o. F who was seen today for physical therapy evaluation and treatment for chronic LBP with previous referral into R LE before laminectomy in 2023. Physical findings are consistent with MD impression as pt demonstrates core/hip weakness as well as piriformis tightness and positive FAIR testing. FOTO score shows subjective decrease in functional ability below PLOF. Pt would benefit from skilled PT services working on improving strength and decreasing pain.   OBJECTIVE IMPAIRMENTS: decreased activity tolerance, decreased endurance, decreased mobility, decreased ROM, decreased strength, postural dysfunction, and pain   ACTIVITY LIMITATIONS: standing, squatting, stairs, transfers, and locomotion level  PARTICIPATION LIMITATIONS: meal prep, cleaning, shopping, community activity, occupation, and yard work  PERSONAL FACTORS: Time since onset of injury/illness/exacerbation and 1-2 comorbidities: HTN, Lumbar Laminectomy/Decompression  2023 are also affecting patient's functional outcome.   REHAB POTENTIAL: Good  CLINICAL DECISION MAKING: Stable/uncomplicated  EVALUATION COMPLEXITY: Low   GOALS: Goals reviewed with patient? No  SHORT TERM GOALS: Target date: 05/31/2023   Pt will be compliant and knowledgeable with initial HEP for improved comfort and carryover Baseline: initial HEP given  Goal status: INITIAL  2.  Pt will self report low back and R LE pain no greater than 3/10 for improved comfort and functional ability Baseline: 5/10 at worst Goal status: INITIAL   LONG TERM GOALS: Target date: 07/05/2023   Pt will improve FOTO function score to no less than 56% as proxy for functional improvement Baseline: 43% function Goal status: INITIAL   2.  Pt will self report low back and R LE pain no greater than 1/10 for improved comfort and functional ability Baseline: 5/10 at worst Goal status: INITIAL   3.  Pt will increase R hip MMT to no less than 5/5 for improved functional mobility and decreased pain Baseline: see MMT chart  Goal status: INITIAL   4.  Pt will be able to stand for 30 minutes not limited by R LE pain to improve comfort and ability while cooking and performing other home ADL tasks Baseline: unable without pain Goal status: INITIAL   PLAN:  PT FREQUENCY: 1x/week  PT DURATION: 8 weeks  PLANNED INTERVENTIONS: 97164- PT Re-evaluation, 97110-Therapeutic exercises, 97530- Therapeutic activity, 97112- Neuromuscular re-education, 97535- Self Care, [id number]- Manual therapy, [id number]- Gait training, [id number]- Aquatic Therapy, 97014- Electrical stimulation (unattended), [id number]- Electrical stimulation (manual), Dry Needling, Cryotherapy, and Moist heat  PLAN FOR NEXT SESSION: assess HEP response, hip strengthening   Alm JAYSON Kingdom, PT 06/10/2023, 12:02 PM

## 2023-06-21 DIAGNOSIS — M5451 Vertebrogenic low back pain: Secondary | ICD-10-CM | POA: Diagnosis not present

## 2023-06-21 DIAGNOSIS — M47896 Other spondylosis, lumbar region: Secondary | ICD-10-CM | POA: Diagnosis not present

## 2023-06-25 ENCOUNTER — Ambulatory Visit: Payer: Medicare Other

## 2023-08-02 DIAGNOSIS — K080 Exfoliation of teeth due to systemic causes: Secondary | ICD-10-CM | POA: Diagnosis not present

## 2023-08-17 ENCOUNTER — Telehealth: Payer: Self-pay | Admitting: Adult Health

## 2023-08-17 NOTE — Telephone Encounter (Addendum)
 Lamictal  level completed by her PCP was 6.0 this was completed February 26, 2023

## 2023-08-19 DIAGNOSIS — M65341 Trigger finger, right ring finger: Secondary | ICD-10-CM | POA: Diagnosis not present

## 2023-08-26 DIAGNOSIS — I129 Hypertensive chronic kidney disease with stage 1 through stage 4 chronic kidney disease, or unspecified chronic kidney disease: Secondary | ICD-10-CM | POA: Diagnosis not present

## 2023-08-26 DIAGNOSIS — Z0000 Encounter for general adult medical examination without abnormal findings: Secondary | ICD-10-CM | POA: Diagnosis not present

## 2023-08-26 DIAGNOSIS — M810 Age-related osteoporosis without current pathological fracture: Secondary | ICD-10-CM | POA: Diagnosis not present

## 2023-08-26 DIAGNOSIS — F39 Unspecified mood [affective] disorder: Secondary | ICD-10-CM | POA: Diagnosis not present

## 2023-08-26 DIAGNOSIS — E782 Mixed hyperlipidemia: Secondary | ICD-10-CM | POA: Diagnosis not present

## 2023-08-26 DIAGNOSIS — N183 Chronic kidney disease, stage 3 unspecified: Secondary | ICD-10-CM | POA: Diagnosis not present

## 2023-09-28 DIAGNOSIS — M5416 Radiculopathy, lumbar region: Secondary | ICD-10-CM | POA: Diagnosis not present

## 2023-11-03 DIAGNOSIS — M5117 Intervertebral disc disorders with radiculopathy, lumbosacral region: Secondary | ICD-10-CM | POA: Diagnosis not present

## 2023-11-03 DIAGNOSIS — M48061 Spinal stenosis, lumbar region without neurogenic claudication: Secondary | ICD-10-CM | POA: Diagnosis not present

## 2023-11-03 DIAGNOSIS — M5116 Intervertebral disc disorders with radiculopathy, lumbar region: Secondary | ICD-10-CM | POA: Diagnosis not present

## 2023-11-03 DIAGNOSIS — M4727 Other spondylosis with radiculopathy, lumbosacral region: Secondary | ICD-10-CM | POA: Diagnosis not present

## 2023-12-02 DIAGNOSIS — M5416 Radiculopathy, lumbar region: Secondary | ICD-10-CM | POA: Diagnosis not present

## 2023-12-08 ENCOUNTER — Other Ambulatory Visit: Payer: Self-pay | Admitting: Adult Health

## 2023-12-09 ENCOUNTER — Other Ambulatory Visit: Payer: Self-pay | Admitting: Adult Health

## 2023-12-21 ENCOUNTER — Encounter: Payer: Self-pay | Admitting: Adult Health

## 2023-12-21 ENCOUNTER — Ambulatory Visit: Payer: Medicare Other | Admitting: Adult Health

## 2023-12-21 VITALS — BP 113/70 | HR 82 | Ht 65.5 in | Wt 141.0 lb

## 2023-12-21 DIAGNOSIS — M5481 Occipital neuralgia: Secondary | ICD-10-CM | POA: Diagnosis not present

## 2023-12-21 DIAGNOSIS — G44209 Tension-type headache, unspecified, not intractable: Secondary | ICD-10-CM

## 2023-12-21 NOTE — Progress Notes (Signed)
 PATIENT: Rachael Webster DOB: 1946-06-19  REASON FOR VISIT: follow up HISTORY FROM: patient PRIMARY NEUROLOGIST: Dr. Ines   Chief Complaint  Patient presents with   RM 19    Patient is here alone for migraine follow-up. She states her headaches are occipital. They do not occur daily, but when they come they are bad. She states it doesn't take much to set my neck off. She sees neurosurgery and may need lumbar spine surgery soon.     HISTORY OF PRESENT ILLNESS: Today 12/21/23:  Rachael Webster is a 78 y.o. female with a history of tension headaches and cervical-occpital neuralgia. Returns today for follow-up. Reports that she gets neck pain  3-4 days a week. Typically located in the upper neck radiating to the occipital region. On occasion will take ibuprofen but is cautious d/t to history of gastric ulcer.  She states that she is also considering back surgery.  She will be seeing a neurosurgeon in the next couple weeks.  Reports that she has been having lower back pain.  Reports a bulging disc.  Has had prior surgery.  Patient is currently happy with treatment plan.  She is taking Lamictal  and Effexor .  Does not wish to try any new medication at this time  01/07/23: Rachael Webster is a 78 y.o. female with a history of tension type headaches and migraine headaches. Returns today for follow-up.  She remains on Lamictal  100 mg twice a day and Effexor  225 mg daily.  Overall she feels that she is doing well.  Has approximately 7 headaches  per month. Will take two extra strength tylenol  daily for leg pain. Will take an additional tylenol  if she has a headache.  Tylenol  typically resolves her tension headache within 30 minutes.  Not necessarily interested in any new medication at this time.   01/06/22: Rachael Webster is a 78 year old female with a history of tension type headaches and migraine headaches.  She returns today for follow-up.  Overall she feels that  she is doing well.  Continues on Lamictal  100 mg twice a day and Effexor  225 mg daily. Reports that headaches are better. She has a headache 2-3  times a week -tension headaches. Uses tylenol  but doesn't normally help. Headache will resolve in two hours. On pain scale 5/10. Always in the Back of the head and feels like a squeezing sensation. Doesn't want to make any changes right now. Has tried multiple medications in the past.   01/01/21: Rachael Webster is a 78 year old female with a history of tension type headaches and migraine headaches.  She returns today for follow-up.  Overall she states that she has been doing well.  She states that her last tension headache was over 2 weeks ago.  She takes Lamictal  100 mg twice a day and Effexor  225 mg daily.  She finds both of these medications beneficial.  She also uses Skelaxin  on occasion and reports that it helps some.  She has scheduled an appointment with psychiatry to discuss her sleep her appointment is in September.  She returns today for an evaluation.  HISTORY (copied from Dr. Sharion note) Rachael Webster is a 78 y.o. female here as requested by Domenica Reusing, MD for migraines. PMHx migraine with aura and without status migrainosus not intractable, persistent disorder of initiating or maintaining sleep.  I reviewed Dr. Vonnie notes, as well as Dr. Letcher notes who was her previous neurologist, patient doing well on Effexor  and Lamictal .  She described  her migraines as severe, moderate mild, per Dr. Letcher notes, location of the headache was the back of the head and neck which would last for days, dull tight, sensitivity to noise and odor, no nausea vomiting sensitivity to light or worsening with movement activities or dizziness.  Headache triggers include fatigue, worrying, odors, medications, hormone changes and emotional stress.  At Dr. Letcher last appointment overall she was doing well, small tension headaches from time to time, it  appears she is on Ambien at bedtime   From a thorough review of records medications that patient has tried that can be used in migraine management include: Aspirin, capsaicin, Tylenol  for, Darvocet, Excedrin, tramadol, DHE injection, Imitrex, Midrin, zolmitriptan, Inderal/propranolol, Norvasc /amlodipine  and atenolol, Benadryl, Dramamine and Phenergan, Aleve, Naprosyn, indomethacin, Toradol, Flexeril, Depakote, Neurontin/gabapentin, topiramate, prednisone, melatonin, ramelteon and Silenor for sleeping, paroxetine, fluoxetine, mirtazapine and doxepin, Botox, Myobloc and trigger point injections.   For years, many many many years she lived with rebound headache and did not know it. She was taking daily medications and daily hydrocodone , when she found out it made a huge difference. Dr. Hagan stopped her daily oxy and it really helped. She significantly improved after that. She has headaches in the back of the head bilaterally and working in the yard will irritate her neck and when that happens metaxalone . She has a dull pressure in the back of the head, no shooting or throbbing but will radiate to behind the eyes, she may have it once a week however worse over the last 6 weeks due to stress. But usually her baseline is once a week, it can last a whole day even treated with tylenol . Over the last few weeks she has been taking medications more. Not throbbing, no light sensitivity but can ave sound sensitivity when the headache is very bad, she has a lot of neck pain usually especially with a headache. Neck pain triggers especially is sleeping on it the wrong way or ding a lot of yard work. She says Lamotrigine  also helped with headaches y possibly helping with her mood as she had back headaches once a month with some depression and the lamotrigine .   She has very poor sleep, she uses ambien and she doesn't want to, she goes to bed at 1130 and takes an ambien, she will read for 45 minutes and then she will lay  down and she is wide awake. She will lay there as long as she will tolerate it, she gets up at 130-2am and plays a computer game, then she goes to bed and sleeps late. I recommended sleep counseling, she had a sleep study.    Reviewed notes, labs and imaging from outside physicians, which showed:    REVIEW OF SYSTEMS: Out of a complete 14 system review of symptoms, the patient complains only of the following symptoms, and all other reviewed systems are negative.  See HPI  ALLERGIES: Allergies  Allergen Reactions   Amitriptyline Hcl     Opposite effect: hyperactive   Asa [Aspirin]     History of stomach ulcer   Nsaids     H/o stomach ulcer 21 yrs ago    HOME MEDICATIONS: Outpatient Medications Prior to Visit  Medication Sig Dispense Refill   amLODipine  (NORVASC ) 10 MG tablet Take 10 mg by mouth daily.      clonazePAM (KLONOPIN) 0.5 MG tablet Take 0.5 mg by mouth 2 (two) times daily as needed. (Patient taking differently: Take 0.25 mg by mouth 2 (two) times daily as  needed. Takes maybe once a week)     estradiol (ESTRACE) 0.1 MG/GM vaginal cream Place 1 Applicatorful vaginally every other day.     ezetimibe  (ZETIA ) 10 MG tablet Take 10 mg by mouth at bedtime.     fluvastatin XL (LESCOL XL) 80 MG 24 hr tablet Take 80 mg by mouth daily.     gabapentin (NEURONTIN) 300 MG capsule Take 300 mg by mouth at bedtime.     lamoTRIgine  (LAMICTAL ) 100 MG tablet TAKE 1 TABLET BY MOUTH TWICE DAILY 180 tablet 3   lamoTRIgine  (LAMICTAL ) 100 MG tablet TAKE 1 TABLET BY MOUTH TWICE DAILY 180 tablet 3   omeprazole (PRILOSEC) 20 MG capsule Take 20 mg by mouth daily.     venlafaxine  XR (EFFEXOR -XR) 75 MG 24 hr capsule TAKE 3 CAPSULES BY MOUTH DAILY 270 capsule 3   ondansetron  (ZOFRAN ) 4 MG tablet Take 1 tablet (4 mg total) by mouth every 8 (eight) hours as needed for nausea or vomiting. 20 tablet 0   No facility-administered medications prior to visit.    PAST MEDICAL HISTORY: Past Medical History:   Diagnosis Date   Abdominal pain    middle of abd, 1 time only   Adenomatous colon polyp    Depression    Fecal incontinence    metamucill helps   FH: stomach ulcer    2001/ per pt, personal hx of ulcer, no family   GERD (gastroesophageal reflux disease)    Headache, chronic daily    Hearing loss    Wears Bil hearing aids.   Hyperlipidemia    Hypertension    Internal hemorrhoids    Osteopenia    Post-operative nausea and vomiting 1986   only 1 time    PAST SURGICAL HISTORY: Past Surgical History:  Procedure Laterality Date   ABDOMINAL HYSTERECTOMY  1989   BREAST BIOPSY Left 2016   BREAST BIOPSY Right 01/06/2018   x2   BREAST EXCISIONAL BIOPSY Right    CHOLECYSTECTOMY  2007   EXPLORATORY LAPAROTOMY  08/24/1999   exploratory laparotomy with Arlyss patch closoure of perforated duodenal ulcer   LUMBAR LAMINECTOMY/DECOMPRESSION MICRODISCECTOMY N/A 11/06/2021   Procedure: Lumbar four to five decompression and discectomy;  Surgeon: Burnetta Aures, MD;  Location: [facility] OR;  Service: Orthopedics;  Laterality: N/A;  3 C-Bed   ROTATOR CUFF REPAIR  1998   right   TONSILLECTOMY  1956   TUBAL LIGATION  1976   WRIST SURGERY  11/26/2016   right wrist    FAMILY HISTORY: Family History  Adopted: Yes  Problem Relation Age of Onset   Colon cancer Neg Hx    Stomach cancer Neg Hx    Headache Neg Hx     SOCIAL HISTORY: Social History   Socioeconomic History   Marital status: Married    Spouse name: Not on file   Number of children: Not on file   Years of education: Not on file   Highest education level: Not on file  Occupational History   Not on file  Tobacco Use   Smoking status: Never   Smokeless tobacco: Never  Vaping Use   Vaping status: Never Used  Substance and Sexual Activity   Alcohol use: No   Drug use: No   Sexual activity: Not on file  Other Topics Concern   Not on file  Social History Narrative   Lives at home with spouse   Left handed   Caffeine: maybe 4  oz per day    Social Drivers of  Health   Financial Resource Strain: Not on file  Food Insecurity: Not on file  Transportation Needs: Not on file  Physical Activity: Not on file  Stress: Not on file  Social Connections: Unknown (10/31/2021)   Received from [facility]   Social Network    Social Network: Not on file  Intimate Partner Violence: Unknown (09/22/2021)   Received from [facility]   HITS    Physically Hurt: Not on file    Insult or Talk Down To: Not on file    Threaten Physical Harm: Not on file    Scream or Curse: Not on file      PHYSICAL EXAM  Vitals:   12/21/23 1303  BP: 113/70  Pulse: 82  Weight: 141 lb (64 kg)  Height: 5' 5.5 (1.664 m)      Body mass index is 23.11 kg/m.  Generalized: Well developed, in no acute distress   Neurological examination  Mentation: Alert oriented to time, place, history taking. Follows all commands speech and language fluent Cranial nerve II-XII: Pupils were equal round reactive to light. Extraocular movements were full, visual field were full on confrontational test. Facial sensation and strength were normal. Uvula tongue midline. Head turning and shoulder shrug  were normal and symmetric. Motor: The motor testing reveals 5 over 5 strength of all 4 extremities. Good symmetric motor tone is noted throughout.  Sensory: Sensory testing is intact to soft touch on all 4 extremities. No evidence of extinction is noted.  Coordination: Cerebellar testing reveals good finger-nose-finger and heel-to-shin bilaterally.  Gait and station: Gait is normal.    DIAGNOSTIC DATA (LABS, IMAGING, TESTING) - I reviewed patient records, labs, notes, testing and imaging myself where available.  Lab Results  Component Value Date   WBC 9.5 11/04/2021   HGB 14.2 11/04/2021   HCT 41.5 11/04/2021   MCV 99.8 11/04/2021   PLT 276 11/04/2021      Component Value Date/Time   NA 142 11/04/2021 1415   K 3.6 11/04/2021 1415   CL 109  11/04/2021 1415   CO2 27 11/04/2021 1415   GLUCOSE 113 (H) 11/04/2021 1415   BUN 16 11/04/2021 1415   CREATININE 0.78 11/04/2021 1415   CALCIUM 10.5 (H) 11/04/2021 1415   GFRNONAA >60 11/04/2021 1415   GFRAA  11/07/2008 1345    >60        The eGFR has been calculated using the MDRD equation. This calculation has not been validated in all clinical situations. eGFR's persistently <60 mL/min signify possible Chronic Kidney Disease.      ASSESSMENT AND PLAN 78 y.o. year old female  has a past medical history of Abdominal pain, Adenomatous colon polyp, Depression, Fecal incontinence, FH: stomach ulcer, GERD (gastroesophageal reflux disease), Headache, chronic daily, Hearing loss, Hyperlipidemia, Hypertension, Internal hemorrhoids, Osteopenia, and Post-operative nausea and vomiting (1986). here with:  1.  Cervico- occipital neuralgia 2.  Tension type headaches   --Continue Lamictal  100 mg twice a day --Continue Effexor  225 mg daily --Patient does not wish to change or add any new medication. --Did caution the patient not using NSAIDs with history of gastric ulcer -- Blood work completed in the last 6 months- she was told everything was normal.  --Advised if symptoms worsen or she develops new symptoms she should let us  know --Follow-up in 6 to 8 months or sooner if needed   Duwaine Russell, MSN, NP-C 12/21/2023, 1:37 PM [facility] [address] [place], [place] [address] [phone]  The patient's condition requires frequent monitoring and adjustments in the treatment plan, reflecting the ongoing complexity of care.  This provider is the continuing focal point for all needed services for this condition.

## 2023-12-21 NOTE — Patient Instructions (Signed)
 Your Plan:  Continue Lamictal  and Effexor      Thank you for coming to see us  at [facility]. I hope we have been able to provide you high quality care today.  You may receive a patient satisfaction survey over the next few weeks. We would appreciate your feedback and comments so that we may continue to improve ourselves and the health of our patients.

## 2023-12-28 DIAGNOSIS — M5416 Radiculopathy, lumbar region: Secondary | ICD-10-CM | POA: Diagnosis not present

## 2024-01-10 ENCOUNTER — Ambulatory Visit: Payer: Medicare Other | Admitting: Adult Health

## 2024-01-24 DIAGNOSIS — M5416 Radiculopathy, lumbar region: Secondary | ICD-10-CM | POA: Diagnosis not present

## 2024-01-24 DIAGNOSIS — M4802 Spinal stenosis, cervical region: Secondary | ICD-10-CM | POA: Diagnosis not present

## 2024-01-24 DIAGNOSIS — M5116 Intervertebral disc disorders with radiculopathy, lumbar region: Secondary | ICD-10-CM | POA: Diagnosis not present

## 2024-02-28 ENCOUNTER — Other Ambulatory Visit: Payer: Self-pay | Admitting: Internal Medicine

## 2024-02-28 DIAGNOSIS — Z1231 Encounter for screening mammogram for malignant neoplasm of breast: Secondary | ICD-10-CM

## 2024-02-29 DIAGNOSIS — I129 Hypertensive chronic kidney disease with stage 1 through stage 4 chronic kidney disease, or unspecified chronic kidney disease: Secondary | ICD-10-CM | POA: Diagnosis not present

## 2024-02-29 DIAGNOSIS — N183 Chronic kidney disease, stage 3 unspecified: Secondary | ICD-10-CM | POA: Diagnosis not present

## 2024-02-29 DIAGNOSIS — M810 Age-related osteoporosis without current pathological fracture: Secondary | ICD-10-CM | POA: Diagnosis not present

## 2024-02-29 DIAGNOSIS — F39 Unspecified mood [affective] disorder: Secondary | ICD-10-CM | POA: Diagnosis not present

## 2024-02-29 DIAGNOSIS — E782 Mixed hyperlipidemia: Secondary | ICD-10-CM | POA: Diagnosis not present

## 2024-03-14 DIAGNOSIS — K080 Exfoliation of teeth due to systemic causes: Secondary | ICD-10-CM | POA: Diagnosis not present

## 2024-03-16 ENCOUNTER — Ambulatory Visit

## 2024-03-21 ENCOUNTER — Ambulatory Visit

## 2024-03-23 DIAGNOSIS — K080 Exfoliation of teeth due to systemic causes: Secondary | ICD-10-CM | POA: Diagnosis not present

## 2024-04-03 ENCOUNTER — Ambulatory Visit
Admission: RE | Admit: 2024-04-03 | Discharge: 2024-04-03 | Disposition: A | Discharge status: Home / Self Care | Source: Ambulatory Visit | Attending: Internal Medicine | Admitting: Internal Medicine

## 2024-04-03 DIAGNOSIS — Z1231 Encounter for screening mammogram for malignant neoplasm of breast: Secondary | ICD-10-CM | POA: Diagnosis not present

## 2024-04-19 DIAGNOSIS — L814 Other melanin hyperpigmentation: Secondary | ICD-10-CM | POA: Diagnosis not present

## 2024-04-19 DIAGNOSIS — L821 Other seborrheic keratosis: Secondary | ICD-10-CM | POA: Diagnosis not present

## 2024-04-19 DIAGNOSIS — D1801 Hemangioma of skin and subcutaneous tissue: Secondary | ICD-10-CM | POA: Diagnosis not present

## 2024-04-19 DIAGNOSIS — L218 Other seborrheic dermatitis: Secondary | ICD-10-CM | POA: Diagnosis not present

## 2024-06-08 DIAGNOSIS — M5416 Radiculopathy, lumbar region: Secondary | ICD-10-CM | POA: Diagnosis not present

## 2024-06-11 ENCOUNTER — Observation Stay (HOSPITAL_COMMUNITY)
Admission: EM | Admit: 2024-06-11 | Discharge: 2024-06-14 | Disposition: A | Discharge status: Home / Self Care | Attending: Family Medicine | Admitting: Family Medicine

## 2024-06-11 ENCOUNTER — Encounter (HOSPITAL_COMMUNITY): Payer: Self-pay | Admitting: Internal Medicine

## 2024-06-11 ENCOUNTER — Other Ambulatory Visit: Payer: Self-pay

## 2024-06-11 ENCOUNTER — Emergency Department (HOSPITAL_COMMUNITY)

## 2024-06-11 DIAGNOSIS — R2689 Other abnormalities of gait and mobility: Secondary | ICD-10-CM | POA: Diagnosis present

## 2024-06-11 DIAGNOSIS — G8929 Other chronic pain: Secondary | ICD-10-CM | POA: Insufficient documentation

## 2024-06-11 DIAGNOSIS — R110 Nausea: Secondary | ICD-10-CM | POA: Diagnosis present

## 2024-06-11 DIAGNOSIS — H905 Unspecified sensorineural hearing loss: Secondary | ICD-10-CM | POA: Insufficient documentation

## 2024-06-11 DIAGNOSIS — I10 Essential (primary) hypertension: Secondary | ICD-10-CM | POA: Diagnosis not present

## 2024-06-11 DIAGNOSIS — F32A Depression, unspecified: Secondary | ICD-10-CM | POA: Diagnosis not present

## 2024-06-11 DIAGNOSIS — G4700 Insomnia, unspecified: Secondary | ICD-10-CM | POA: Diagnosis not present

## 2024-06-11 DIAGNOSIS — M545 Low back pain, unspecified: Secondary | ICD-10-CM | POA: Diagnosis not present

## 2024-06-11 DIAGNOSIS — N3000 Acute cystitis without hematuria: Principal | ICD-10-CM | POA: Insufficient documentation

## 2024-06-11 DIAGNOSIS — M549 Dorsalgia, unspecified: Secondary | ICD-10-CM | POA: Diagnosis present

## 2024-06-11 DIAGNOSIS — E876 Hypokalemia: Secondary | ICD-10-CM | POA: Diagnosis not present

## 2024-06-11 DIAGNOSIS — Z87440 Personal history of urinary (tract) infections: Secondary | ICD-10-CM | POA: Insufficient documentation

## 2024-06-11 DIAGNOSIS — M5416 Radiculopathy, lumbar region: Secondary | ICD-10-CM | POA: Diagnosis not present

## 2024-06-11 DIAGNOSIS — N390 Urinary tract infection, site not specified: Secondary | ICD-10-CM | POA: Diagnosis present

## 2024-06-11 DIAGNOSIS — K219 Gastro-esophageal reflux disease without esophagitis: Secondary | ICD-10-CM | POA: Diagnosis not present

## 2024-06-11 DIAGNOSIS — Z79899 Other long term (current) drug therapy: Secondary | ICD-10-CM | POA: Diagnosis not present

## 2024-06-11 DIAGNOSIS — G2581 Restless legs syndrome: Secondary | ICD-10-CM | POA: Diagnosis not present

## 2024-06-11 DIAGNOSIS — B9620 Unspecified Escherichia coli [E. coli] as the cause of diseases classified elsewhere: Secondary | ICD-10-CM | POA: Diagnosis not present

## 2024-06-11 DIAGNOSIS — R531 Weakness: Secondary | ICD-10-CM | POA: Diagnosis not present

## 2024-06-11 DIAGNOSIS — E785 Hyperlipidemia, unspecified: Secondary | ICD-10-CM | POA: Diagnosis present

## 2024-06-11 DIAGNOSIS — N118 Other chronic tubulo-interstitial nephritis: Secondary | ICD-10-CM | POA: Insufficient documentation

## 2024-06-11 DIAGNOSIS — H8112 Benign paroxysmal vertigo, left ear: Secondary | ICD-10-CM | POA: Diagnosis not present

## 2024-06-11 DIAGNOSIS — N12 Tubulo-interstitial nephritis, not specified as acute or chronic: Secondary | ICD-10-CM | POA: Diagnosis not present

## 2024-06-11 DIAGNOSIS — R296 Repeated falls: Secondary | ICD-10-CM

## 2024-06-11 LAB — COMPREHENSIVE METABOLIC PANEL WITH GFR
ALT: 23 U/L (ref 0–44)
AST: 24 U/L (ref 15–41)
Albumin: 4.6 g/dL (ref 3.5–5.0)
Alkaline Phosphatase: 118 U/L (ref 38–126)
Anion gap: 13 (ref 5–15)
BUN: 12 mg/dL (ref 8–23)
CO2: 23 mmol/L (ref 22–32)
Calcium: 10.5 mg/dL — ABNORMAL HIGH (ref 8.9–10.3)
Chloride: 103 mmol/L (ref 98–111)
Creatinine, Ser: 0.6 mg/dL (ref 0.44–1.00)
GFR, Estimated: >60 mL/min
Glucose, Bld: 166 mg/dL — ABNORMAL HIGH (ref 70–99)
Potassium: 3.4 mmol/L — ABNORMAL LOW (ref 3.5–5.1)
Sodium: 139 mmol/L (ref 135–145)
Total Bilirubin: 0.4 mg/dL (ref 0.0–1.2)
Total Protein: 7.2 g/dL (ref 6.5–8.1)

## 2024-06-11 LAB — DIFFERENTIAL
Abs Immature Granulocytes: 0.02 K/uL (ref 0.00–0.07)
Basophils Absolute: 0 K/uL (ref 0.0–0.1)
Basophils Relative: 0 %
Eosinophils Absolute: 0 K/uL (ref 0.0–0.5)
Eosinophils Relative: 0 %
Immature Granulocytes: 0 %
Lymphocytes Relative: 12 %
Lymphs Abs: 1 K/uL (ref 0.7–4.0)
Monocytes Absolute: 0.3 K/uL (ref 0.1–1.0)
Monocytes Relative: 4 %
Neutro Abs: 6.5 K/uL (ref 1.7–7.7)
Neutrophils Relative %: 84 %

## 2024-06-11 LAB — URINALYSIS, ROUTINE W REFLEX MICROSCOPIC
Bilirubin Urine: NEGATIVE
Glucose, UA: NEGATIVE mg/dL
Hgb urine dipstick: NEGATIVE
Ketones, ur: NEGATIVE mg/dL
Nitrite: POSITIVE — AB
Protein, ur: NEGATIVE mg/dL
Specific Gravity, Urine: 1.015 (ref 1.005–1.030)
pH: 8 (ref 5.0–8.0)

## 2024-06-11 LAB — CBC
HCT: 43.5 % (ref 36.0–46.0)
Hemoglobin: 15.2 g/dL — ABNORMAL HIGH (ref 12.0–15.0)
MCH: 33.7 pg (ref 26.0–34.0)
MCHC: 34.9 g/dL (ref 30.0–36.0)
MCV: 96.5 fL (ref 80.0–100.0)
Platelets: 257 K/uL (ref 150–400)
RBC: 4.51 MIL/uL (ref 3.87–5.11)
RDW: 12.7 % (ref 11.5–15.5)
WBC: 7.9 K/uL (ref 4.0–10.5)
nRBC: 0 % (ref 0.0–0.2)

## 2024-06-11 LAB — URINE DRUG SCREEN
Amphetamines: NEGATIVE
Barbiturates: NEGATIVE
Benzodiazepines: NEGATIVE
Cocaine: NEGATIVE
Fentanyl: NEGATIVE
Methadone Scn, Ur: NEGATIVE
Opiates: NEGATIVE
Tetrahydrocannabinol: NEGATIVE

## 2024-06-11 LAB — URINALYSIS, MICROSCOPIC (REFLEX)

## 2024-06-11 LAB — PROTIME-INR
INR: 0.9 (ref 0.8–1.2)
Prothrombin Time: 13.1 s (ref 11.4–15.2)

## 2024-06-11 LAB — APTT: aPTT: 22 s — ABNORMAL LOW (ref 24–36)

## 2024-06-11 LAB — CBG MONITORING, ED: Glucose-Capillary: 148 mg/dL — ABNORMAL HIGH (ref 70–99)

## 2024-06-11 LAB — ETHANOL: Alcohol, Ethyl (B): <15 mg/dL

## 2024-06-11 MED ORDER — SODIUM CHLORIDE 0.9% FLUSH
3.0000 mL | Freq: Two times a day (BID) | INTRAVENOUS | Status: DC
  Administered 2024-06-11 – 2024-06-14 (×5): 3 mL via INTRAVENOUS

## 2024-06-11 MED ORDER — HYDRALAZINE HCL 20 MG/ML IJ SOLN: 10.0000 mg | INTRAMUSCULAR | Status: DC | PRN

## 2024-06-11 MED ORDER — PANTOPRAZOLE SODIUM 40 MG PO TBEC
40.0000 mg | DELAYED_RELEASE_TABLET | Freq: Every day | ORAL | Status: DC
  Administered 2024-06-12 – 2024-06-14 (×3): 40 mg via ORAL
  Filled 2024-06-11 (×4): qty 1

## 2024-06-11 MED ORDER — SODIUM CHLORIDE 0.9 % IV SOLN
1.0000 g | Freq: Once | INTRAVENOUS | Status: AC
  Administered 2024-06-11: 1 g via INTRAVENOUS
  Filled 2024-06-11: qty 10

## 2024-06-11 MED ORDER — GABAPENTIN 300 MG PO CAPS
300.0000 mg | ORAL_CAPSULE | Freq: Every day | ORAL | Status: DC
  Administered 2024-06-11 – 2024-06-13 (×3): 300 mg via ORAL
  Filled 2024-06-11 (×3): qty 1

## 2024-06-11 MED ORDER — METHYLPREDNISOLONE 4 MG PO TBPK
4.0000 mg | ORAL_TABLET | Freq: Three times a day (TID) | ORAL | Status: AC
  Administered 2024-06-11 – 2024-06-12 (×3): 4 mg via ORAL
  Filled 2024-06-11: qty 21

## 2024-06-11 MED ORDER — ACETAMINOPHEN 650 MG RE SUPP: 650.0000 mg | Freq: Four times a day (QID) | RECTAL | Status: DC | PRN

## 2024-06-11 MED ORDER — LAMOTRIGINE 100 MG PO TABS
100.0000 mg | ORAL_TABLET | Freq: Two times a day (BID) | ORAL | Status: DC
  Administered 2024-06-11 – 2024-06-14 (×6): 100 mg via ORAL
  Filled 2024-06-11 (×6): qty 1

## 2024-06-11 MED ORDER — METHYLPREDNISOLONE 4 MG PO TBPK: 4.0000 mg | ORAL_TABLET | Freq: Three times a day (TID) | ORAL | Status: DC

## 2024-06-11 MED ORDER — SODIUM CHLORIDE 0.9 % IV SOLN
1.0000 g | INTRAVENOUS | Status: DC
  Administered 2024-06-12: 1 g via INTRAVENOUS
  Filled 2024-06-11: qty 10

## 2024-06-11 MED ORDER — PRAVASTATIN SODIUM 40 MG PO TABS
40.0000 mg | ORAL_TABLET | Freq: Every day | ORAL | Status: DC
  Administered 2024-06-11 – 2024-06-13 (×3): 40 mg via ORAL
  Filled 2024-06-11 (×3): qty 1

## 2024-06-11 MED ORDER — MECLIZINE HCL 25 MG PO TABS: 25.0000 mg | ORAL_TABLET | Freq: Two times a day (BID) | ORAL | Status: DC | PRN

## 2024-06-11 MED ORDER — VENLAFAXINE HCL ER 75 MG PO CP24
225.0000 mg | ORAL_CAPSULE | Freq: Every day | ORAL | Status: DC
  Administered 2024-06-11 – 2024-06-14 (×4): 225 mg via ORAL
  Filled 2024-06-11 (×4): qty 3

## 2024-06-11 MED ORDER — ENOXAPARIN SODIUM 40 MG/0.4ML IJ SOSY
40.0000 mg | PREFILLED_SYRINGE | INTRAMUSCULAR | Status: DC
  Administered 2024-06-11 – 2024-06-13 (×3): 40 mg via SUBCUTANEOUS
  Filled 2024-06-11 (×3): qty 0.4

## 2024-06-11 MED ORDER — ACETAMINOPHEN 325 MG PO TABS
650.0000 mg | ORAL_TABLET | Freq: Four times a day (QID) | ORAL | Status: DC | PRN
  Administered 2024-06-11 – 2024-06-13 (×3): 650 mg via ORAL
  Filled 2024-06-11 (×4): qty 2

## 2024-06-11 MED ORDER — METHYLPREDNISOLONE 4 MG PO TBPK
4.0000 mg | ORAL_TABLET | Freq: Four times a day (QID) | ORAL | Status: DC
  Administered 2024-06-13 – 2024-06-14 (×6): 4 mg via ORAL

## 2024-06-11 MED ORDER — ALBUTEROL SULFATE (2.5 MG/3ML) 0.083% IN NEBU: 2.5000 mg | INHALATION_SOLUTION | Freq: Four times a day (QID) | RESPIRATORY_TRACT | Status: DC | PRN

## 2024-06-11 MED ORDER — POTASSIUM CHLORIDE CRYS ER 20 MEQ PO TBCR
20.0000 meq | EXTENDED_RELEASE_TABLET | Freq: Once | ORAL | Status: AC
  Administered 2024-06-11: 20 meq via ORAL
  Filled 2024-06-11: qty 1

## 2024-06-11 MED ORDER — EZETIMIBE 10 MG PO TABS
10.0000 mg | ORAL_TABLET | Freq: Every day | ORAL | Status: DC
  Administered 2024-06-11 – 2024-06-13 (×3): 10 mg via ORAL
  Filled 2024-06-11 (×3): qty 1

## 2024-06-11 MED ORDER — AMLODIPINE BESYLATE 10 MG PO TABS
10.0000 mg | ORAL_TABLET | Freq: Every day | ORAL | Status: DC
  Administered 2024-06-11 – 2024-06-14 (×4): 10 mg via ORAL
  Filled 2024-06-11 (×2): qty 1
  Filled 2024-06-11: qty 2
  Filled 2024-06-11: qty 1

## 2024-06-11 MED ORDER — MECLIZINE HCL 25 MG PO TABS
25.0000 mg | ORAL_TABLET | Freq: Three times a day (TID) | ORAL | Status: DC | PRN
  Filled 2024-06-11: qty 1

## 2024-06-11 MED ORDER — CLONAZEPAM 0.5 MG PO TABS
0.5000 mg | ORAL_TABLET | Freq: Two times a day (BID) | ORAL | Status: DC | PRN
  Administered 2024-06-11: 0.5 mg via ORAL
  Filled 2024-06-11: qty 1

## 2024-06-11 MED ORDER — PHENAZOPYRIDINE HCL 100 MG PO TABS
100.0000 mg | ORAL_TABLET | Freq: Three times a day (TID) | ORAL | Status: DC
  Administered 2024-06-11 – 2024-06-14 (×8): 100 mg via ORAL
  Filled 2024-06-11 (×10): qty 1

## 2024-06-11 MED ORDER — ONDANSETRON HCL 4 MG/2ML IJ SOLN
4.0000 mg | Freq: Four times a day (QID) | INTRAMUSCULAR | Status: DC | PRN
  Administered 2024-06-12: 4 mg via INTRAVENOUS
  Filled 2024-06-11: qty 2

## 2024-06-11 MED ORDER — METHYLPREDNISOLONE 4 MG PO TBPK: 4.0000 mg | ORAL_TABLET | ORAL | Status: DC

## 2024-06-11 MED ORDER — ONDANSETRON HCL 4 MG/2ML IJ SOLN
4.0000 mg | Freq: Once | INTRAMUSCULAR | Status: AC
  Administered 2024-06-11: 4 mg via INTRAVENOUS
  Filled 2024-06-11: qty 2

## 2024-06-11 MED ORDER — ONDANSETRON HCL 4 MG PO TABS
4.0000 mg | ORAL_TABLET | Freq: Four times a day (QID) | ORAL | Status: DC | PRN
  Administered 2024-06-11 – 2024-06-14 (×4): 4 mg via ORAL
  Filled 2024-06-11 (×5): qty 1

## 2024-06-11 MED ORDER — METHYLPREDNISOLONE 4 MG PO TBPK
8.0000 mg | ORAL_TABLET | Freq: Every evening | ORAL | Status: AC
  Administered 2024-06-12: 8 mg via ORAL

## 2024-06-11 NOTE — ED Triage Notes (Signed)
 Pt. BIB [facility] from home with c/o nausea and vomiting; Pt. Stated that she just felt off this morning and vomited in route. Pt. Has a Hx of HTN.   VS per [facility]:  BP: 120/74 HR: 72 RR: 20 SpO2: 98% RA CBG: 176 Temp: 97.3

## 2024-06-11 NOTE — ED Provider Notes (Signed)
 " [facility] AT [facility] Provider Note   CSN: [id number] Arrival date & time: 06/11/24  9164     Patient presents with: Nausea   Rachael Webster is a 77 y.o. female.  {Add pertinent medical, surgical, social history, OB history to HPI:32947} HPI   Patient has a history of hypertension hyperlipidemia chronic headaches, depression, acid reflux who presents ED with complaints of weakness nausea feeling off balance.  Patient states she started noticing symptoms last evening.  She has felt off since then.  Patient has been feeling nauseated.  Whenever she tries to get up she feels like she is falling backwards.  Patient has not been able to stand.  Patient denies having any trouble with focal weakness or numbness in her arms or legs.  She feels off balance.  She is not have any trouble with her vision or speech.  Patient also has noticed some increasing urinary frequency.  Family states the urine had a bad odor.  Patient did have an episode of nausea vomiting as she was being transported to the ED  Prior to Admission medications  Medication Sig Start Date End Date Taking? Authorizing Provider  amLODipine  (NORVASC ) 10 MG tablet Take 10 mg by mouth daily.  12/09/11   [provider]  clonazePAM  (KLONOPIN ) 0.5 MG tablet Take 0.5 mg by mouth 2 (two) times daily as needed. Patient taking differently: Take 0.25 mg by mouth 2 (two) times daily as needed. Takes maybe once a week    [provider]  estradiol (ESTRACE) 0.1 MG/GM vaginal cream Place 1 Applicatorful vaginally every other day. 05/13/21   [provider]  ezetimibe  (ZETIA ) 10 MG tablet Take 10 mg by mouth at bedtime.    [provider]  fluvastatin XL (LESCOL XL) 80 MG 24 hr tablet Take 80 mg by mouth daily.    [provider]  gabapentin  (NEURONTIN ) 300 MG capsule Take 300 mg by mouth at bedtime.    [provider]  lamoTRIgine  (LAMICTAL ) 100  MG tablet TAKE 1 TABLET BY MOUTH TWICE DAILY 12/09/23   Millikan, Megan, NP  lamoTRIgine  (LAMICTAL ) 100 MG tablet TAKE 1 TABLET BY MOUTH TWICE DAILY 12/09/23   Millikan, Megan, NP  omeprazole (PRILOSEC) 20 MG capsule Take 20 mg by mouth daily.    [provider]  venlafaxine  XR (EFFEXOR -XR) 75 MG 24 hr capsule TAKE 3 CAPSULES BY MOUTH DAILY 12/09/23   Millikan, Megan, NP    Allergies: Amitriptyline hcl, Asa [aspirin], and Nsaids    Review of Systems  Updated Vital Signs BP (!) 160/80 (BP Location: Left Arm)   Pulse 69   Temp 97.8 F (36.6 C)   Resp 20   SpO2 100%   Physical Exam Vitals and nursing note reviewed.  Constitutional:      General: She is not in acute distress.    Appearance: She is well-developed.  HENT:     Head: Normocephalic and atraumatic.     Right Ear: External ear normal.     Left Ear: External ear normal.  Eyes:     General: No visual field deficit or scleral icterus.       Right eye: No discharge.        Left eye: No discharge.     Conjunctiva/sclera: Conjunctivae normal.  Neck:     Trachea: No tracheal deviation.  Cardiovascular:     Rate and Rhythm: Normal rate and regular rhythm.  Pulmonary:     Effort:  Pulmonary effort is normal. No respiratory distress.     Breath sounds: Normal breath sounds. No stridor. No wheezing or rales.  Abdominal:     General: Bowel sounds are normal. There is no distension.     Palpations: Abdomen is soft.     Tenderness: There is no abdominal tenderness. There is no guarding or rebound.  Musculoskeletal:        General: No tenderness.     Cervical back: Neck supple.  Skin:    General: Skin is warm and dry.     Findings: No rash.  Neurological:     Mental Status: She is alert and oriented to person, place, and time.     Cranial Nerves: No cranial nerve deficit, dysarthria or facial asymmetry.     Sensory: No sensory deficit.     Motor: Tremor present. No abnormal muscle tone, seizure activity or pronator  drift.     Coordination: Coordination normal.     Comments:  able to hold both legs off bed for 5 seconds, sensation intact in all extremities,  no left or right sided neglect, normal finger-nose exam bilaterally, no nystagmus noted   Psychiatric:        Mood and Affect: Mood normal.     (all labs ordered are listed, but only abnormal results are displayed) Labs Reviewed  APTT - Abnormal; Notable for the following components:      Result Value   aPTT 22 (*)    All other components within normal limits  CBC - Abnormal; Notable for the following components:   Hemoglobin 15.2 (*)    All other components within normal limits  COMPREHENSIVE METABOLIC PANEL WITH GFR - Abnormal; Notable for the following components:   Potassium 3.4 (*)    Glucose, Bld 166 (*)    Calcium 10.5 (*)    All other components within normal limits  URINALYSIS, ROUTINE W REFLEX MICROSCOPIC - Abnormal; Notable for the following components:   Nitrite POSITIVE (*)    Leukocytes,Ua TRACE (*)    All other components within normal limits  URINALYSIS, MICROSCOPIC (REFLEX) - Abnormal; Notable for the following components:   Bacteria, UA RARE (*)    All other components within normal limits  CBG MONITORING, ED - Abnormal; Notable for the following components:   Glucose-Capillary 148 (*)    All other components within normal limits  PROTIME-INR  DIFFERENTIAL  ETHANOL  URINE DRUG SCREEN    EKG: EKG Interpretation Date/Time:  Sunday June 11 2024 10:13:29 EST Ventricular Rate:  60 PR Interval:  158 QRS Duration:  92 QT Interval:  420 QTC Calculation: 420 R Axis:   42  Text Interpretation: Normal sinus rhythm Cannot rule out Anterior infarct , age undetermined Abnormal ECG When compared with ECG of 04-Nov-2021 14:25, No significant change since last tracing Confirmed by Randol Simmonds [phone]) on 06/11/2024 10:18:49 AM  Radiology: CT HEAD WO CONTRAST Result Date: 06/11/2024 EXAM: CT HEAD WITHOUT CONTRAST  06/11/2024 09:35:53 AM TECHNIQUE: CT of the head was performed without the administration of intravenous contrast. Automated exposure control, iterative reconstruction, and/or weight based adjustment of the mA/kV was utilized to reduce the radiation dose to as low as reasonably achievable. COMPARISON: None available. CLINICAL HISTORY: Neuro deficit, acute, stroke suspected. FINDINGS: BRAIN AND VENTRICLES: No acute hemorrhage. No evidence of acute infarct. Patchy and confluent decreased attenuation throughout deep and periventricular white matter bilaterally, compatible with chronic microvascular ischemic disease. No hydrocephalus. No extra-axial collection. No mass effect or midline shift.  ORBITS: Bilateral lens replacement. SINUSES: No acute abnormality. SOFT TISSUES AND SKULL: No acute soft tissue abnormality. No skull fracture. Hyperostosis frontalis. IMPRESSION: 1. No acute intracranial abnormality. 2. Chronic microvascular ischemic disease in the deep and periventricular white matter bilaterally. Electronically signed by: Evalene Coho MD 06/11/2024 09:39 AM EST RP Workstation: [id number]    {Document cardiac monitor, telemetry assessment procedure when appropriate:32947} Procedures   Medications Ordered in the ED  cefTRIAXone  (ROCEPHIN ) 1 g in sodium chloride  0.9 % 100 mL IVPB (0 g Intravenous Stopped 06/11/24 1116)    Clinical Course as of 06/11/24 1139  Sun Jun 11, 2024  1016 CBC(!) Normal [JK]  1016 Urinalysis, Routine w reflex microscopic -Urine, Catheterized(!) UA suggestive of UTI [JK]  1017 Comprehensive metabolic panel(!) Metabolic panel unremarkable [JK]  1017 Head CT without acute findings [JK]    Clinical Course User Index [JK] Randol Simmonds, MD   {Click here for ABCD2, HEART and other calculators REFRESH Note before signing:1}                              Medical Decision Making Problems Addressed: Acute cystitis without hematuria: acute illness or injury that poses a  threat to life or bodily functions Falls: acute illness or injury that poses a threat to life or bodily functions Weakness: acute illness or injury that poses a threat to life or bodily functions  Amount and/or Complexity of Data Reviewed Labs: ordered. Decision-making details documented in ED Course. Radiology: ordered and independent interpretation performed.   Patient presented to the ED with complaints of nausea vomiting feeling weak.  Patient states she was having difficulty standing.  Consider the possibility of acute stroke.  CT scan fortunately does not show any acute abnormalities.  On neurologic exam she does not have any focal neurologic deficits although I did not get patient up to ambulate.    Son was concerned about the possibility of UTI as she has had some urinary frequency.  Labs do not show any signs of anemia or dehydration.  Her urinalysis does suggest a UTI.  Patient has been having urinary frequency I have started her on antibiotics and will send off for culture.  It is possible that her weakness and unsteadiness is related to the UTI and not an occult stroke.  With her weakness and falls I will consult the medical service for admission further evaluation  {Document critical care time when appropriate  Document review of labs and clinical decision tools ie CHADS2VASC2, etc  Document your independent review of radiology images and any outside records  Document your discussion with family members, caretakers and with consultants  Document social determinants of health affecting pt's care  Document your decision making why or why not admission, treatments were needed:32947:::1}   Final diagnoses:  Acute cystitis without hematuria  Weakness  Falls    ED Discharge Orders     None        "

## 2024-06-11 NOTE — ED Notes (Signed)
 EDP at [facility]

## 2024-06-11 NOTE — ED Notes (Signed)
 Patient transported to CT

## 2024-06-11 NOTE — H&P (Addendum)
 " History and Physical    Patient: Rachael Webster FMW:[id number] DOB: June 25, 1945 DOA: 06/11/2024 DOS: the patient was seen and examined on 06/11/2024 PCP: Vernon Velna SAUNDERS, MD  Patient coming from: Home via EMS  Chief Complaint:  Chief Complaint  Patient presents with   Nausea   HPI: Rachael Webster is a 78 y.o. female with medical history significant of hypertension, hyperlipidemia,presents with sudden onset of balance issues and nausea.  She began experiencing symptoms around 9 PM the previous night, starting with a feeling of being off balance when getting up from her recliner. The imbalance worsened overnight, leading to an inability to move from the bed to a walker without assistance. She describes the sensation as an inability to lift herself up and walk, stating 'my legs wouldn't work.'  Nausea accompanies the balance issues.  She has had some burning discomfort with urination.  She has a history of recurrent UTIs, which she referred to as her 'annual UTI,' but notes that none were like this current episode.  She recently started a prednisone dose pack two days ago for back pain and was also prescribed methocarbamol , a muscle relaxer, which she had taken previously without issues. She had not taken methocarbamol  since 11 AM the day before the symptoms began and was concerned it might be contributing to her symptoms.  Her calcium levels have been slightly elevated for about twenty years. She recently switched from Centrum Silver to One A Day Silver, which may also contain calcium. She was previously advised to stop and then restart vitamin D3 supplementation, which she is currently taking.  In the emergency department patient was noted to be afebrile with blood pressure elevated 160/80, and all other vital signs maintained.  Labs noted.  Hemoglobin 15.2, potassium 3.4, and calcium 10.5. Urinalysis significant for trace leukocytes, positive nitrites, rare bacteria,  and 11-20 WBCs.  CT imaging of the head did not reveal any acute abnormality.  Patient was given Zofran  and empiric antibiotics of Rocephin .  Review of Systems: As mentioned in the history of present illness. All other systems reviewed and are negative. Past Medical History:  Diagnosis Date   Abdominal pain    middle of abd, 1 time only   Adenomatous colon polyp    Depression    Fecal incontinence    metamucill helps   FH: stomach ulcer    2001/ per pt, personal hx of ulcer, no family   GERD (gastroesophageal reflux disease)    Headache, chronic daily    Hearing loss    Wears Bil hearing aids.   Hyperlipidemia    Hypertension    Internal hemorrhoids    Osteopenia    Post-operative nausea and vomiting 1986   only 1 time   Past Surgical History:  Procedure Laterality Date   ABDOMINAL HYSTERECTOMY  1989   BREAST BIOPSY Left 2016   BREAST BIOPSY Right 01/06/2018   x2   BREAST EXCISIONAL BIOPSY Right    CHOLECYSTECTOMY  2007   EXPLORATORY LAPAROTOMY  08/24/1999   exploratory laparotomy with Arlyss patch closoure of perforated duodenal ulcer   LUMBAR LAMINECTOMY/DECOMPRESSION MICRODISCECTOMY N/A 11/06/2021   Procedure: Lumbar four to five decompression and discectomy;  Surgeon: Burnetta Aures, MD;  Location: [facility] OR;  Service: Orthopedics;  Laterality: N/A;  3 C-Bed   ROTATOR CUFF REPAIR  1998   right   TONSILLECTOMY  1956   TUBAL LIGATION  1976   WRIST SURGERY  11/26/2016   right wrist   Social History:  reports that she has never smoked. She has never used smokeless tobacco. She reports that she does not drink alcohol and does not use drugs.  Allergies[1]  Family History  Adopted: Yes  Problem Relation Age of Onset   Colon cancer Neg Hx    Stomach cancer Neg Hx    Headache Neg Hx    Breast cancer Neg Hx     Prior to Admission medications  Medication Sig Start Date End Date Taking? Authorizing Provider  amLODipine  (NORVASC ) 10 MG tablet Take 10 mg by mouth daily.   12/09/11   [provider]  clonazePAM  (KLONOPIN ) 0.5 MG tablet Take 0.5 mg by mouth 2 (two) times daily as needed. Patient taking differently: Take 0.25 mg by mouth 2 (two) times daily as needed. Takes maybe once a week    [provider]  estradiol (ESTRACE) 0.1 MG/GM vaginal cream Place 1 Applicatorful vaginally every other day. 05/13/21   [provider]  ezetimibe  (ZETIA ) 10 MG tablet Take 10 mg by mouth at bedtime.    [provider]  fluvastatin XL (LESCOL XL) 80 MG 24 hr tablet Take 80 mg by mouth daily.    [provider]  gabapentin  (NEURONTIN ) 300 MG capsule Take 300 mg by mouth at bedtime.    [provider]  lamoTRIgine  (LAMICTAL ) 100 MG tablet TAKE 1 TABLET BY MOUTH TWICE DAILY 12/09/23   Millikan, Megan, NP  lamoTRIgine  (LAMICTAL ) 100 MG tablet TAKE 1 TABLET BY MOUTH TWICE DAILY 12/09/23   Millikan, Megan, NP  omeprazole (PRILOSEC) 20 MG capsule Take 20 mg by mouth daily.    [provider]  venlafaxine  XR (EFFEXOR -XR) 75 MG 24 hr capsule TAKE 3 CAPSULES BY MOUTH DAILY 12/09/23   Sherryl Bouchard, NP    Physical Exam: Vitals:   06/11/24 0836  BP: (!) 160/80  Pulse: 69  Resp: 20  Temp: 97.8 F (36.6 C)  SpO2: 100%     Constitutional: Elderly female NAD, calm, comfortable Eyes: PERRL, lids and conjunctivae normal.  Some leftward beating nystagmus noted when checking extraocular movements ENMT: Mucous membranes are moist. Posterior pharynx clear of any exudate or lesions.Normal dentition.  Neck: normal, supple, no masses, no thyromegaly Respiratory: clear to auscultation bilaterally, no wheezing, no crackles. Normal respiratory effort. No accessory muscle use.  Cardiovascular: Regular rate and rhythm, no murmurs / rubs / gallops. No extremity edema. 2+ pedal pulses. No carotid bruits.  Abdomen: no tenderness, no masses palpated. No hepatosplenomegaly. Bowel sounds positive.  Musculoskeletal: no clubbing / cyanosis.  No joint deformity upper and lower extremities. Good ROM, no contractures. Normal muscle tone.  Skin: no rashes, lesions, ulcers. No induration Neurologic: CN 2-12 grossly intact. Sensation intact, DTR normal. Strength 5/5 in all 4.  Psychiatric: Normal judgment and insight. Alert and oriented x 3. Normal mood.   Data Reviewed:  EKG revealed a normal sinus rhythm at 60 bpm with no significant changes from prior tracing.  Reviewed labs, imaging, and pertinent records as documented.  Assessment and Plan:  Urinary tract infection Present on admission.  Patient reported feeling nauseated.  Urinalysis significant for trace leukocytes, positive nitrites, rare bacteria, and 11-20 WBCs.  Patient was started on empiric antibiotics of Rocephin . - Admit to a telemetry bed - Follow-up urine culture - Continue empiric antibiotics of Rocephin  - Pyridium  for pain  Balance problem Since about 9 PM yesterday evening patient reports feeling off balance where she was falling backwards.  She admits to taking a Flexeril earlier in  the day had taken medication previously and not had similar symptoms.  Question if secondary to medication or the possibility of benign positional vertigo. - Hold Robaxin  - Meclizine  p.o. as needed for dizziness - Physical therapy to evaluate and treat  Back pain Patient had recently been started on Medrol   Dosepak on Friday as well as Flexeril. - Continue Solu-Medrol   Hypokalemia Hypercalcemia Acute.  Patient noted to have calcium mildly low at 3.4 and calcium 10.5.  Patient reports still taking a multivitamin as well as vitamin D  - Give 20 mEq of potassium chloride  - Hold multivitamin.  Advised patient in regards to possible causes of increased calcium levels with regards to her current supplement  Essential hypertension Blood pressure initially elevated up to 173/85. - Continue amlodipine  - Hydralazine  IV as needed  Hyperlipidemia - Continue Zetia  and pharmacy  substitution of pravastatin .  Depression - Continue venlafaxine     GERD - Continue pharmacy substitution for omeprazole  DVT prophylaxis: Lovenox  Advance Care Planning:   Code Status: Full Code    Consults: none  Family Communication: Son updated at bedside  Severity of Illness: The appropriate patient status for this patient is INPATIENT. Inpatient status is judged to be reasonable and necessary in order to provide the required intensity of service to ensure the patient's safety. The patient's presenting symptoms, physical exam findings, and initial radiographic and laboratory data in the context of their chronic comorbidities is felt to place them at high risk for further clinical deterioration. Furthermore, it is not anticipated that the patient will be medically stable for discharge from the hospital within 2 midnights of admission.   * I certify that at the point of admission it is my clinical judgment that the patient will require inpatient hospital care spanning beyond 2 midnights from the point of admission due to high intensity of service, high risk for further deterioration and high frequency of surveillance required.*  Author: Maximino DELENA Sharps, MD 06/11/2024 12:05 PM  For on call review [email or web address].      [1]  Allergies Allergen Reactions   Amitriptyline Hcl     Opposite effect: hyperactive   Asa [Aspirin]     History of stomach ulcer   Nsaids     H/o stomach ulcer 21 yrs ago   "

## 2024-06-11 NOTE — ED Notes (Signed)
 Notified 61M pt waiting for transport. Asked to wait 10 minutes because nurses are still getting report. Primary RN will contact transport at [phone].

## 2024-06-12 DIAGNOSIS — N390 Urinary tract infection, site not specified: Secondary | ICD-10-CM | POA: Diagnosis not present

## 2024-06-12 LAB — BASIC METABOLIC PANEL WITH GFR
Anion gap: 9 (ref 5–15)
BUN: 14 mg/dL (ref 8–23)
CO2: 26 mmol/L (ref 22–32)
Calcium: 9.9 mg/dL (ref 8.9–10.3)
Chloride: 102 mmol/L (ref 98–111)
Creatinine, Ser: 0.69 mg/dL (ref 0.44–1.00)
GFR, Estimated: >60 mL/min
Glucose, Bld: 122 mg/dL — ABNORMAL HIGH (ref 70–99)
Potassium: 3.6 mmol/L (ref 3.5–5.1)
Sodium: 137 mmol/L (ref 135–145)

## 2024-06-12 LAB — CBC
HCT: 40 % (ref 36.0–46.0)
Hemoglobin: 13.8 g/dL (ref 12.0–15.0)
MCH: 33.5 pg (ref 26.0–34.0)
MCHC: 34.5 g/dL (ref 30.0–36.0)
MCV: 97.1 fL (ref 80.0–100.0)
Platelets: 236 K/uL (ref 150–400)
RBC: 4.12 MIL/uL (ref 3.87–5.11)
RDW: 12.9 % (ref 11.5–15.5)
WBC: 12.3 K/uL — ABNORMAL HIGH (ref 4.0–10.5)
nRBC: 0 % (ref 0.0–0.2)

## 2024-06-12 MED ORDER — PROCHLORPERAZINE MALEATE 5 MG PO TABS
5.0000 mg | ORAL_TABLET | Freq: Three times a day (TID) | ORAL | Status: DC | PRN
  Administered 2024-06-12: 5 mg via ORAL
  Filled 2024-06-12 (×2): qty 1

## 2024-06-12 NOTE — Progress Notes (Signed)
 Pt refused standing out of bed. Stated feeling weak. Unable to obtained standing position.  Laying : 143/71 (93) pulse: 65 Sitting : 135/87 (103) pulse 70 Sitting in 3 min : 158/86 (108) pulse 70  Amado GORMAN Arabia, RN

## 2024-06-12 NOTE — Progress Notes (Signed)
 Pt stated feeling weak and tired, unable to get out of bed at this time.  Will continue to monitor pt.   Amado GORMAN Arabia, RN

## 2024-06-12 NOTE — Care Management Obs Status (Signed)
 MEDICARE OBSERVATION STATUS NOTIFICATION   Patient Details  Name: Rachael Webster MRN: [id number] Date of Birth: 03-02-1946   Medicare Observation Status Notification Given:  Yes    Tom-Johnson, Harvest Muskrat, RN 06/12/2024, 3:53 PM

## 2024-06-12 NOTE — Evaluation (Signed)
 Physical Therapy Vestibular Evaluation Patient Details Name: Rachael Webster MRN: [id number] DOB: 1946-02-23 Today's Date: 06/12/2024  History of Present Illness  Pt is a 78 y/o female who presents 06/11/2024 with dizziness, nausea, and sudden balance deficits. CT negative for acute intracranial changes. Pt found to have UTI. PMH significant for recurrent UTI, HTN, osteopenia, lumbar laminectomy 2023, R RCR 1998.   Clinical Impression  Pt admitted with above diagnosis. Pt currently with functional limitations due to the deficits listed below (see PT Problem List). At the time of PT eval, focus was on vestibular assessment. Pt with constant left beating nystagmus. Somewhat increased velocity when testing L side in general, but no position elicited significantly increased symptoms. Grossly negative for posterior canal and horizontal canal BPPV. Pt presentation consistent with a vestibular neuritis. Unable to progress mobility assessment this session due to nausea. Anticipate when dizziness/nausea subsides, pt will progress well functionally. Pt will benefit from acute skilled PT to increase their independence and safety with mobility to allow discharge.           If plan is discharge home, recommend the following: A little help with walking and/or transfers;A little help with bathing/dressing/bathroom;Assistance with cooking/housework;Assist for transportation;Help with stairs or ramp for entrance   Can travel by private vehicle        Equipment Recommendations None recommended by PT  Recommendations for Other Services       Functional Status Assessment Patient has had a recent decline in their functional status and demonstrates the ability to make significant improvements in function in a reasonable and predictable amount of time.     Precautions / Restrictions Precautions Precautions: Fall Recall of Precautions/Restrictions: Intact Restrictions Weight Bearing Restrictions  Per Provider Order: No      Mobility  Bed Mobility Overal bed mobility: Needs Assistance Bed Mobility: Rolling, Supine to Sit Rolling: Supervision   Supine to sit: Supervision     General bed mobility comments: Pt able to roll R and L, and come into long sitting in bed without assist. Use of rails required for transition into long sitting.    Transfers                   General transfer comment: Pt unable to tolerate due to nausea    Ambulation/Gait                  Stairs            Wheelchair Mobility     Tilt Bed    Modified Rankin (Stroke Patients Only)       Balance                                             Pertinent Vitals/Pain Pain Assessment Pain Assessment: No/denies pain    Home Living Family/patient expects to be discharged to:: Private residence Living Arrangements: Spouse/significant other Available Help at Discharge: Family;Available 24 hours/day Type of Home: House Home Access: Stairs to enter Entrance Stairs-Rails: Right;Left;Can reach both Entrance Stairs-Number of Steps: 5   Home Layout: One level Home Equipment: Shower seat - built in;Grab bars - tub/shower;Cane - single Librarian, Academic (2 wheels)      Prior Function Prior Level of Function : Independent/Modified Independent;Driving             Mobility Comments: No AD  Extremity/Trunk Assessment   Upper Extremity Assessment Upper Extremity Assessment: Overall WFL for tasks assessed    Lower Extremity Assessment Lower Extremity Assessment: Overall WFL for tasks assessed    Cervical / Trunk Assessment Cervical / Trunk Assessment: Other exceptions Cervical / Trunk Exceptions: Forward head posture with rounded shoulders  Communication   Communication Communication: No apparent difficulties    Cognition Arousal: Alert Behavior During Therapy: WFL for tasks assessed/performed   PT - Cognitive impairments: No  apparent impairments                         Following commands: Intact       Cueing Cueing Techniques: Verbal cues, Gestural cues     General Comments      Exercises     Assessment/Plan    PT Assessment Patient needs continued PT services  PT Problem List Decreased strength;Decreased mobility;Decreased balance;Decreased knowledge of use of DME;Decreased safety awareness;Decreased knowledge of precautions;Pain       PT Treatment Interventions DME instruction;Gait training;Stair training;Functional mobility training;Therapeutic activities;Therapeutic exercise;Balance training;Patient/family education    PT Goals (Current goals can be found in the Care Plan section)  Acute Rehab PT Goals Patient Stated Goal: Decrease nausea PT Goal Formulation: With patient Time For Goal Achievement: 06/26/24 Potential to Achieve Goals: Good    Frequency Min 2X/week     Co-evaluation               AM-PAC PT 6 Clicks Mobility  Outcome Measure Help needed turning from your back to your side while in a flat bed without using bedrails?: A Little Help needed moving from lying on your back to sitting on the side of a flat bed without using bedrails?: A Little Help needed moving to and from a bed to a chair (including a wheelchair)?: A Little Help needed standing up from a chair using your arms (e.g., wheelchair or bedside chair)?: A Little Help needed to walk in hospital room?: Total Help needed climbing 3-5 steps with a railing? : Total 6 Click Score: 14    End of Session   Activity Tolerance: Other (comment) (Limited by nausea) Patient left: in bed;with call bell/phone within reach;with bed alarm set;with family/visitor present Nurse Communication: Mobility status PT Visit Diagnosis: Unsteadiness on feet (R26.81);Difficulty in walking, not elsewhere classified (R26.2);Dizziness and giddiness (R42)    Time: 8667-8641 PT Time Calculation (min) (ACUTE ONLY): 26  min   Charges:   PT Evaluation $PT Eval Moderate Complexity: 1 Mod PT Treatments $Therapeutic Activity: 8-22 mins PT General Charges $$ ACUTE PT VISIT: 1 Visit         Rachael Webster, PT, DPT Acute Rehabilitation Services Secure Chat Preferred Office: [phone]    Rachael Webster 06/12/2024, 3:18 PM

## 2024-06-12 NOTE — TOC CM/SW Note (Signed)
 Transition of Care [facility]) - Inpatient Brief Assessment   Patient Details  Name: Rachael Webster MRN: [id number] Date of Birth: 01-30-1946  Transition of Care [facility]) CM/SW Contact:    Tom-Johnson, Ahnya Akre Daphne, RN Phone Number: 06/12/2024, 3:57 PM   Clinical Narrative:   Patient presented to the ED with worsened imbalance and Nausea. Found to have elevated BP at 160/80, Urinalysis with trace of Leukocytes, positive Nitrites, rare Bacteria, and 11-20 WBC's. Admitted with UTI, on IV abx.   CM spoke with patient at bedside about needs for post hospital transition. Lives with her husband, has two supportive children. Retired, independent with care and drive self. Has a cane, walker, shower seat and grab bars at home. PCP is Pahwani, MD and uses [facility] on [address]. No Hx of Home health disciplines.      Outpatient PT/Vestibular recommended, order and referral info on AVS.  Patient not Medically ready for discharge.  CM will continue to follow as patient progresses with care towards discharge.          Transition of Care Asessment: Insurance and Status: Insurance coverage has been reviewed Patient has primary care physician: Yes Home environment has been reviewed: Yes Prior level of function:: Modified Independent Prior/Current Home Services: No current home services Social Drivers of Health Review: SDOH reviewed no interventions necessary Readmission risk has been reviewed: Yes Transition of care needs: no transition of care needs at this time

## 2024-06-12 NOTE — Care Management CC44 (Signed)
"         Condition Code 44 Documentation Completed  Patient Details  Name: Rachael Webster MRN: [id number] Date of Birth: 1946-06-14   Condition Code 44 given:  Yes Patient signature on Condition Code 44 notice:  Yes Documentation of 2 MD's agreement:  Yes Code 44 added to claim:  Yes    Tom-Johnson, Harvest Muskrat, RN 06/12/2024, 3:53 PM  "

## 2024-06-12 NOTE — Progress Notes (Signed)
 [facility]  Rachael Webster FMW:[id number]  DOB: 1945/11/07  DOA: 06/11/2024  PCP: Vernon Velna SAUNDERS, MD  06/12/2024,5:40 AM  LOS: 1 day    Code Status: Full code     from: Home   78 year old active female Previous L4-L5 compression discectomy 11/06/2021 with continued back pain Migraines with aura versus cervical occipital neuralgia and tension headaches Insomnia Lumbar radiculopathy followed by Dr. Bonner Sensorineural hearing loss  Her son [name] who is a respiratory therapist here supplements the history-feeling poorly 12/21-had nausea vomiting, + frequency urination, - dysuria-unable to stand brought by EMS--- had recently been started on Robaxin  as well as a steroid Dosepak for back pain-typically does not have problems taking Robaxin -r-she has been on Skelaxin  f without issue previously for tension migraines and cervicalgia CT head no contrast chronic microvascular ischemic disease deep periventricular white matter Potassium 3.4 BUN/creatinine 12/0.6--WBC 7.9 hemoglobin 15.2 platelet 257-UA showed trace leukocyte positive nitrite rare bacteria Urine culture growing >100,000 CFU   Assessment  & Plan :    Vertigo On exam has fast beat component to the left with torsional component downward on the left Vestibular rehab has been requested--- was not able to get orthostatics because she felt swimmy headed Will attempt again later today I will stop meclizine  25 3 times daily for now and observe  Gram-negative UTI Continue ceftriaxone  1 g-IV saline lock--- Pyridium  seem to offer some help so continue 100 3 times daily female  Chronic lumbago Sees Dr. Bonner in the outpatient-Medrol  Dosepak has been resumed and will finish taper as per his instructions from PTA Continue gabapentin  300 at bedtime Lamictal  100 twice daily  RLS Patient does not take scheduled Klonopin  only takes as needed-I have discontinued it as she was sleepy this morning  Hypertension Concern for orthostasis as  well Resuming amlodipine  10 hydralazine  as needed 10 every 4 as needed blood pressure >180  Depression Continue Effexor  225 daily  Data Reviewed today: Sodium 137 potassium 3.6 BUN/creatinine 14/0.6 WBC 12.3 hemoglobin 13.8 platelet 236   DVT prophylaxis: Lovenox    Dispo/Global plan: Likely discharge home when stabilized and able to get up may need therapy to see --- patient's son updated at bedside    Subjective:   Quite sleepy this morning-received BZD at around 945 last night No chest pain No nausea no vomiting  Objective + exam Vitals:   06/11/24 2013 06/11/24 2020 06/12/24 0441 06/12/24 0535  BP: (!) 159/77  110/63   Pulse: 66  60   Resp: 13     Temp: 98.4 F (36.9 C)  98.3 F (36.8 C)   TempSrc: Oral  Oral   SpO2: 95%  95%   Height:  5' 5 (1.651 m)  5' 5 (1.651 m)   There were no vitals filed for this visit.   Examination: EOMI NCAT no focal deficit no icterus no pallor frail-no JVD-neck soft supple--chest is clear no wheeze--abdomen soft no rebound No lower extremity edema ROM intact moving 4 limbs equally     Scheduled Meds:  amLODipine   10 mg Oral Daily   enoxaparin  (LOVENOX ) injection  40 mg Subcutaneous Q24H   ezetimibe   10 mg Oral QHS   gabapentin   300 mg Oral QHS   lamoTRIgine   100 mg Oral BID   [START ON 06/13/2024] methylPREDNISolone   4 mg Oral 4X daily taper   methylPREDNISolone   4 mg Oral 3 x daily with food   methylPREDNISolone   8 mg Oral Nightly   pantoprazole   40 mg Oral Daily  phenazopyridine   100 mg Oral TID WC   pravastatin   40 mg Oral q1800   sodium chloride  flush  3 mL Intravenous Q12H   venlafaxine  XR  225 mg Oral Daily   Continuous Infusions:  cefTRIAXone  (ROCEPHIN )  IV     acetaminophen  **OR** acetaminophen , albuterol , clonazePAM , hydrALAZINE , meclizine , ondansetron  **OR** ondansetron  (ZOFRAN ) IV  Time 45  Colen Grimes, MD  [facility]

## 2024-06-13 DIAGNOSIS — N390 Urinary tract infection, site not specified: Secondary | ICD-10-CM | POA: Diagnosis not present

## 2024-06-13 LAB — CBC WITH DIFFERENTIAL/PLATELET
Abs Immature Granulocytes: 0.06 K/uL (ref 0.00–0.07)
Basophils Absolute: 0 K/uL (ref 0.0–0.1)
Basophils Relative: 0 %
Eosinophils Absolute: 0 K/uL (ref 0.0–0.5)
Eosinophils Relative: 0 %
HCT: 40 % (ref 36.0–46.0)
Hemoglobin: 14.6 g/dL (ref 12.0–15.0)
Immature Granulocytes: 1 %
Lymphocytes Relative: 8 %
Lymphs Abs: 1 K/uL (ref 0.7–4.0)
MCH: 34.5 pg — ABNORMAL HIGH (ref 26.0–34.0)
MCHC: 36.5 g/dL — ABNORMAL HIGH (ref 30.0–36.0)
MCV: 94.6 fL (ref 80.0–100.0)
Monocytes Absolute: 0.6 K/uL (ref 0.1–1.0)
Monocytes Relative: 5 %
Neutro Abs: 11.5 K/uL — ABNORMAL HIGH (ref 1.7–7.7)
Neutrophils Relative %: 86 %
Platelets: 248 K/uL (ref 150–400)
RBC: 4.23 MIL/uL (ref 3.87–5.11)
RDW: 12.6 % (ref 11.5–15.5)
WBC: 13.2 K/uL — ABNORMAL HIGH (ref 4.0–10.5)
nRBC: 0 % (ref 0.0–0.2)

## 2024-06-13 LAB — BASIC METABOLIC PANEL WITH GFR
Anion gap: 10 (ref 5–15)
BUN: 15 mg/dL (ref 8–23)
CO2: 26 mmol/L (ref 22–32)
Calcium: 10.5 mg/dL — ABNORMAL HIGH (ref 8.9–10.3)
Chloride: 101 mmol/L (ref 98–111)
Creatinine, Ser: 0.63 mg/dL (ref 0.44–1.00)
GFR, Estimated: >60 mL/min
Glucose, Bld: 130 mg/dL — ABNORMAL HIGH (ref 70–99)
Potassium: 3.6 mmol/L (ref 3.5–5.1)
Sodium: 137 mmol/L (ref 135–145)

## 2024-06-13 LAB — URINE CULTURE: Culture: >=100000 — AB

## 2024-06-13 MED ORDER — PHENAZOPYRIDINE HCL 100 MG PO TABS: 100.0000 mg | ORAL_TABLET | Freq: Three times a day (TID) | ORAL | 0 refills | Status: AC

## 2024-06-13 MED ORDER — ACETAMINOPHEN 325 MG PO TABS: 650.0000 mg | ORAL_TABLET | Freq: Four times a day (QID) | ORAL | Status: AC | PRN

## 2024-06-13 MED ORDER — LEVOFLOXACIN 500 MG PO TABS: 500.0000 mg | ORAL_TABLET | Freq: Every day | ORAL | Status: DC

## 2024-06-13 MED ORDER — AMOXICILLIN 500 MG PO CAPS
500.0000 mg | ORAL_CAPSULE | Freq: Three times a day (TID) | ORAL | Status: DC
  Administered 2024-06-13 – 2024-06-14 (×5): 500 mg via ORAL
  Filled 2024-06-13 (×5): qty 1

## 2024-06-13 MED ORDER — AMOXICILLIN 500 MG PO CAPS: 500.0000 mg | ORAL_CAPSULE | Freq: Three times a day (TID) | ORAL | 0 refills | Status: AC

## 2024-06-13 NOTE — TOC Progression Note (Addendum)
 Transition of Care [facility]) - Progression Note    Patient Details  Name: Rachael Webster MRN: [id number] Date of Birth: July 26, 1945  Transition of [facility]) CM/SW Contact  Tom-Johnson, Ashritha Desrosiers Daphne, RN Phone Number: 06/13/2024, 12:27 PM  Clinical Narrative:     Patient's disposition updated by PT to Home Health and transition to Outpt PT, patient has no preference. Referral sent through the HUB and accepted, info on AVS. CM spoke with [facility] liaison, Burnard about start date, Burnard states d/t the holidays, if patient is discharged tomorrow, patient will not be seen in 72hrs. Start date will be the weekend.   Patient not Medically ready for discharge.  CM will continue to follow as patient progresses with care towards discharge.                         Expected Discharge Plan and Services                                               Social Drivers of Health (SDOH) Interventions SDOH Screenings   Food Insecurity: No Food Insecurity (06/11/2024)  Housing: Low Risk (06/11/2024)  Transportation Needs: No Transportation Needs (06/11/2024)  Utilities: Not At Risk (06/11/2024)  Social Connections: Moderately Isolated (06/11/2024)  Tobacco Use: Low Risk (06/11/2024)    Readmission Risk Interventions     No data to display

## 2024-06-13 NOTE — Progress Notes (Signed)
 Urine cx>>pan sens e.coli. Ok to change levaquin  to PO amoxicillin  to complete 5d per Dr. Royal.  Sergio Batch, PharmD, BCIDP, AAHIVP, CPP Infectious Disease Pharmacist 06/13/2024 8:43 AM

## 2024-06-13 NOTE — Progress Notes (Signed)
 Physical Therapy Treatment Patient Details Name: Quinta Eimer MRN: [id number] DOB: 28-May-1946 Today's Date: 06/13/2024   History of Present Illness Pt is a 78 y/o female who presents 06/11/2024 with dizziness, nausea, and sudden balance deficits. CT negative for acute intracranial changes. Pt found to have UTI. PMH significant for recurrent UTI, HTN, osteopenia, lumbar laminectomy 2023, R RCR 1998.    PT Comments  Pt admitted with above diagnosis. Pt with left anterior canal BPPV and treeated via Epley maneuver. Pt was able to sit up in chair after treatment. Needing mod assist to walk. Son present and educated regarding guarding pt with gait belt as well as educated pt and son in HEP for BPPV. Issued gait belt for pt safety. Should progress and go home with HHPT f/u and transition to Outpt PT for vestibular rehab.  Sons to take off work and assist pt over next few weeks.  Pt currently with functional limitations due to the deficits listed below (see PT Problem List). Pt will benefit from acute skilled PT to increase their independence and safety with mobility to allow discharge.       If plan is discharge home, recommend the following: A little help with walking and/or transfers;A little help with bathing/dressing/bathroom;Assistance with cooking/housework;Assist for transportation;Help with stairs or ramp for entrance   Can travel by private vehicle        Equipment Recommendations  None recommended by PT, Issued gait belt   Recommendations for Other Services       Precautions / Restrictions Precautions Precautions: Fall Recall of Precautions/Restrictions: Intact Restrictions Weight Bearing Restrictions Per Provider Order: No     Mobility  Bed Mobility Overal bed mobility: Needs Assistance Bed Mobility: Rolling, Supine to Sit Rolling: Supervision   Supine to sit: Contact guard     General bed mobility comments: Pt able to roll R and L.  Tested for BPPV and pt  positive for left anterior canal BPPV and treated with Epley maneuver. Use of rails required for transition into to EOB as well as guiding as pt had just tolerated the Epley maneuver.    Transfers Overall transfer level: Needs assistance Equipment used: Rolling walker (2 wheels) Transfers: Sit to/from Stand Sit to Stand: Min assist           General transfer comment: Needs steadying assist due to nausea and dizziness after the manuever.    Ambulation/Gait Ambulation/Gait assistance: Mod assist Gait Distance (Feet): 18 Feet Assistive device: Rolling walker (2 wheels) Gait Pattern/deviations: Decreased stride length, Trunk flexed, Narrow base of support, Drifts right/left, Leaning posteriorly, Ataxic, Decreased step length - right, Decreased step length - left   Gait velocity interpretation: <1.31 ft/sec, indicative of household ambulator   General Gait Details: Pt slow and cautious with gait with RW after Epley needing mod assist and cues for safety. Pt unsteady needing incr assist.  Pt also nauseated as well.  Taught pt compensation techniques as well.   Stairs             Wheelchair Mobility     Tilt Bed    Modified Rankin (Stroke Patients Only)       Balance                                            Communication Communication Communication: No apparent difficulties  Cognition Arousal: Alert Behavior During Therapy: [facility] for  tasks assessed/performed   PT - Cognitive impairments: No apparent impairments                         Following commands: Intact      Cueing Cueing Techniques: Verbal cues, Gestural cues  Exercises Other Exercises Other Exercises: Access Code: [email or web address]  URL: [email or web address]  Date: 06/13/2024  Prepared by: Stephane    Exercises  - Self-Epley Maneuver Right Ear  - 2 x daily - 7 x weekly - 1 sets - 10 reps  - Brandt-Daroff Vestibular Exercise  - 2 x daily - 7 x weekly - 1 sets - 5 reps  -  Rolling rightleft sides for vestibular habituation  - 2 x daily - 7 x weekly - 1 sets - 10 reps    Patient Education  - What Is BPPV?  - BPPV - Son and pt educated to all of the above and gave handout    General Comments General comments (skin integrity, edema, etc.): VSS      Pertinent Vitals/Pain Pain Assessment Pain Assessment: No/denies pain    Home Living                          Prior Function            PT Goals (current goals can now be found in the care plan section) Acute Rehab PT Goals Patient Stated Goal: Decrease nausea Progress towards PT goals: Progressing toward goals    Frequency    Min 2X/week      PT Plan      Co-evaluation              AM-PAC PT 6 Clicks Mobility   Outcome Measure  Help needed turning from your back to your side while in a flat bed without using bedrails?: A Little Help needed moving from lying on your back to sitting on the side of a flat bed without using bedrails?: A Little Help needed moving to and from a bed to a chair (including a wheelchair)?: A Lot Help needed standing up from a chair using your arms (e.g., wheelchair or bedside chair)?: A Lot Help needed to walk in hospital room?: Total Help needed climbing 3-5 steps with a railing? : Total 6 Click Score: 12    End of Session Equipment Utilized During Treatment: Gait belt Activity Tolerance: Other (comment);Patient limited by fatigue (Limited by nausea) Patient left: with call bell/phone within reach;with family/visitor present;in chair;with chair alarm set Nurse Communication: Mobility status PT Visit Diagnosis: Unsteadiness on feet (R26.81);Difficulty in walking, not elsewhere classified (R26.2);Dizziness and giddiness (R42)     Time: 9158-9080 PT Time Calculation (min) (ACUTE ONLY): 38 min  Charges:    $Gait Training: 8-22 mins $Therapeutic Exercise: 8-22 mins $Canalith Rep Proc: 8-22 mins PT General Charges $$ ACUTE PT VISIT: 1  Visit                     Carol Theys M,PT Acute Rehab Services [phone]    Stephane JULIANNA Bevel 06/13/2024, 10:24 AM

## 2024-06-13 NOTE — Discharge Summary (Signed)
 Physician Discharge Summary  Rachael Webster FMW:[id number] DOB: May 21, 1946 DOA: 06/11/2024  PCP: Vernon Velna SAUNDERS, MD  Admit date: 06/11/2024 Discharge date: 06/13/2024  Time spent: 40 minutes  Recommendations for Outpatient Follow-up:  Complete antibiotics as below [facility] referral [facility] to [facility] after the holidays-son is aware of the same Recommend de-escalation polypharmacy as an outpatient  Discharge Diagnoses:  MAIN problem for hospitalization   Severe left anterior canal BPPV E. coli pyelonephritis  Please see below for itemized issues addressed in HOpsital- refer to other progress notes for clarity if needed  Discharge Condition: Improved  Diet recommendation: Heart healthy  Filed Weights   06/12/24 2209  Weight: 64.3 kg    History of present illness:  78 year old active female Previous L4-L5 compression discectomy 11/06/2021 with continued back pain Migraines with aura versus cervical occipital neuralgia and tension headaches Insomnia Lumbar radiculopathy followed by Dr. Bonner Sensorineural hearing loss   Her son [name] who is a respiratory therapist here supplements the history-feeling poorly 12/21-had nausea vomiting, + frequency urination, - dysuria-unable to stand brought by EMS--- had recently been started on Robaxin  as well as a steroid Dosepak for back pain-typically does not have problems taking Robaxin -r-she has been on Skelaxin  f without issue previously for tension migraines and cervicalgia CT head no contrast chronic microvascular ischemic disease deep periventricular white matter Potassium 3.4 BUN/creatinine 12/0.6--WBC 7.9 hemoglobin 15.2 platelet 257-UA showed trace leukocyte positive nitrite rare bacteria Urine culture growing >100,000 CFU    Assessment  & Plan :      Vertigo On exam has fast beat component to the left with torsional component downward on the left Initially was not able to get orthostatics  because she felt swimmy headed Vestibular rehab was performed and she was nauseous but did have left beat nystagmus with BPPV left anterior canal and it was treated by the Epley maneuver and the son was educated about using a gait belt and patient will get outpatient therapy as above   E. coli pyelonephritis on admission  Ceftriaxone  was narrowed in favor of Amoxil  for 5 days as it is pansensitive Can use Pyridium  in outpatient setting  Chronic lumbago Sees Dr. Bonner in the outpatient-Medrol  Dosepak has been resumed and will finish taper as per his instructions from PTA Continue gabapentin  300 at bedtime Lamictal  100 twice daily   RLS Patient does not take scheduled Klonopin  only takes as needed-this was resumed as per home regimen and she is aware to take it when she really needs it   Hypertension Concern for orthostasis as well Resuming amlodipine  10 hydralazine  as needed 10 every 4 as needed blood pressure >180   Depression Continue Effexor  225 daily   Please try to de-escalate polypharmacy in the outpatient setting-she is stabilized her son who is a respiratory therapist at [employer] is quite familiar with how to manage her and she is stable for discharge today  Discharge Exam: Vitals:   06/13/24 0721 06/13/24 1613  BP: 129/72 138/73  Pulse: 63 70  Resp: 18 18  Temp: 98.3 F (36.8 C) 98.1 F (36.7 C)  SpO2: 97% 95%    Subj on day of d/c   Awake coherent was a little bit dizzy after treatment with Epley's maneuver but overall feeling well  General Exam on discharge  EOMI NCAT no focal deficit no icterus no pallor Chest is clear no wheeze S1-S2 no murmur no rub no gallop Abdomen soft no rebound ROM intact Power 5/5  Discharge  Instructions   Discharge Instructions     Ambulatory referral to Physical Therapy   Complete by: As directed    Vestibular   Discharge instructions   Complete by: As directed    Requires completion of antibiotics please take all of  them at discharge for the urinary infection It would be a good idea to continue outpatient vestibular rehab to help with the dizziness that you have-it is important that you get this followed up in the outpatient setting and continue to go to therapy for outpatient vestibular rehab we will make the referral High-grade fevers chills nausea vomiting please come to the emergency room-you may use some Pyridium  for urinary urgency/pain but do not use it long-term I would recommend that you are primary physician look at your gabapentin  Lamictal  Effexor  Klonopin  and determine If all of them are needed or not   Increase activity slowly   Complete by: As directed       Allergies as of 06/13/2024       Reactions   Amitriptyline Hcl    Opposite effect: hyperactive   Asa [aspirin]    History of stomach ulcer   Nsaids    H/o stomach ulcer 21 yrs ago        Medication List     TAKE these medications    acetaminophen  325 MG tablet Commonly known as: TYLENOL  Take 2 tablets (650 mg total) by mouth every 6 (six) hours as needed for mild pain (pain score 1-3) or fever (or Fever >/= 101).   amLODipine  10 MG tablet Commonly known as: NORVASC  Take 10 mg by mouth daily.   amoxicillin  500 MG capsule Commonly known as: AMOXIL  Take 1 capsule (500 mg total) by mouth every 8 (eight) hours for 9 doses.   clonazePAM  0.5 MG tablet Commonly known as: KLONOPIN  Take 0.5 mg by mouth 2 (two) times daily as needed. What changed:  how much to take additional instructions   estradiol 0.1 MG/GM vaginal cream Commonly known as: ESTRACE Place 1 Applicatorful vaginally every other day.   ezetimibe  10 MG tablet Commonly known as: ZETIA  Take 10 mg by mouth at bedtime.   fluvastatin XL 80 MG 24 hr tablet Commonly known as: LESCOL XL Take 80 mg by mouth daily.   gabapentin  300 MG capsule Commonly known as: NEURONTIN  Take 300 mg by mouth at bedtime.   lamoTRIgine  100 MG tablet Commonly known as:  LAMICTAL  TAKE 1 TABLET BY MOUTH TWICE DAILY   Magnesium 250 MG Tabs Take 1 tablet by mouth daily.   methocarbamol  500 MG tablet Commonly known as: ROBAXIN  Take 500 mg by mouth every 8 (eight) hours as needed.   methylPREDNISolone  4 MG Tbpk tablet Commonly known as: MEDROL  DOSEPAK Take 4 mg by mouth as directed.   Multi Vitamin Tabs Take 1 tablet by mouth daily.   Omega 3 1000 MG Caps Take 1 capsule by mouth See admin instructions. 4 times a week   omeprazole 20 MG capsule Commonly known as: PRILOSEC Take 20 mg by mouth daily.   phenazopyridine  100 MG tablet Commonly known as: PYRIDIUM  Take 1 tablet (100 mg total) by mouth 3 (three) times daily with meals.   QC TUMERIC COMPLEX PO Take 2 tablets by mouth daily.   venlafaxine  XR 75 MG 24 hr capsule Commonly known as: EFFEXOR -XR TAKE 3 CAPSULES BY MOUTH DAILY       Allergies[1]  Contact information for follow-up providers     [facility] Follow  up.   Specialty: Rehabilitation Why: Call to schedule first Vestivular PT appointment. Contact information: [address] [place]  [address] [phone]             Contact information for after-discharge care     Home Medical Care     [facility]) .   Service: Home Health Services Contact information: [address] [place]  [phone]                      The results of significant diagnostics from this hospitalization (including imaging, microbiology, ancillary and laboratory) are listed below for reference.    Significant Diagnostic Studies: CT HEAD WO CONTRAST Result Date: 06/11/2024 EXAM: CT HEAD WITHOUT CONTRAST 06/11/2024 09:35:53 AM TECHNIQUE: CT of the head was performed without the administration of intravenous contrast. Automated exposure control, iterative reconstruction, and/or weight based  adjustment of the mA/kV was utilized to reduce the radiation dose to as low as reasonably achievable. COMPARISON: None available. CLINICAL HISTORY: Neuro deficit, acute, stroke suspected. FINDINGS: BRAIN AND VENTRICLES: No acute hemorrhage. No evidence of acute infarct. Patchy and confluent decreased attenuation throughout deep and periventricular white matter bilaterally, compatible with chronic microvascular ischemic disease. No hydrocephalus. No extra-axial collection. No mass effect or midline shift. ORBITS: Bilateral lens replacement. SINUSES: No acute abnormality. SOFT TISSUES AND SKULL: No acute soft tissue abnormality. No skull fracture. Hyperostosis frontalis. IMPRESSION: 1. No acute intracranial abnormality. 2. Chronic microvascular ischemic disease in the deep and periventricular white matter bilaterally. Electronically signed by: Evalene Coho MD 06/11/2024 09:39 AM EST RP Workstation: [id number]    Microbiology: Recent Results (from the past 240 hours)  Urine Culture     Status: Abnormal   Collection Time: 06/11/24  2:01 PM   Specimen: Urine, Clean Catch  Result Value Ref Range Status   Specimen Description URINE, CLEAN CATCH  Final   Special Requests   Final    NONE Performed at [facility], [address], [place], [place] [address]    Culture >=100,000 COLONIES/mL ESCHERICHIA COLI (A)  Final   Report Status 06/13/2024 FINAL  Final   Organism ID, Bacteria ESCHERICHIA COLI (A)  Final      Susceptibility   Escherichia coli - MIC*    AMPICILLIN <=2 SENSITIVE Sensitive     CEFAZOLIN  (URINE) Value in next row Sensitive      <=1 SENSITIVEThis is a modified FDA-approved test that has been validated and its performance characteristics determined by the reporting laboratory.  This laboratory is certified under the Clinical Laboratory Improvement Amendments CLIA as qualified to perform high complexity clinical laboratory testing.    CEFEPIME Value in next row Sensitive      <=1  SENSITIVEThis is a modified FDA-approved test that has been validated and its performance characteristics determined by the reporting laboratory.  This laboratory is certified under the Clinical Laboratory Improvement Amendments CLIA as qualified to perform high complexity clinical laboratory testing.    ERTAPENEM Value in next row Sensitive      <=1 SENSITIVEThis is a modified FDA-approved test that has been validated and its performance characteristics determined by the reporting laboratory.  This laboratory is certified under the Clinical Laboratory Improvement Amendments CLIA as qualified to perform high complexity clinical laboratory testing.    CEFTRIAXONE  Value in next row Sensitive      <=1 SENSITIVEThis is a modified FDA-approved test that has been validated  and its performance characteristics determined by the reporting laboratory.  This laboratory is certified under the Clinical Laboratory Improvement Amendments CLIA as qualified to perform high complexity clinical laboratory testing.    CIPROFLOXACIN Value in next row Sensitive      <=1 SENSITIVEThis is a modified FDA-approved test that has been validated and its performance characteristics determined by the reporting laboratory.  This laboratory is certified under the Clinical Laboratory Improvement Amendments CLIA as qualified to perform high complexity clinical laboratory testing.    GENTAMICIN Value in next row Sensitive      <=1 SENSITIVEThis is a modified FDA-approved test that has been validated and its performance characteristics determined by the reporting laboratory.  This laboratory is certified under the Clinical Laboratory Improvement Amendments CLIA as qualified to perform high complexity clinical laboratory testing.    NITROFURANTOIN Value in next row Sensitive      <=1 SENSITIVEThis is a modified FDA-approved test that has been validated and its performance characteristics determined by the reporting laboratory.  This laboratory  is certified under the Clinical Laboratory Improvement Amendments CLIA as qualified to perform high complexity clinical laboratory testing.    TRIMETH/SULFA Value in next row Sensitive      <=1 SENSITIVEThis is a modified FDA-approved test that has been validated and its performance characteristics determined by the reporting laboratory.  This laboratory is certified under the Clinical Laboratory Improvement Amendments CLIA as qualified to perform high complexity clinical laboratory testing.    AMPICILLIN/SULBACTAM Value in next row Sensitive      <=1 SENSITIVEThis is a modified FDA-approved test that has been validated and its performance characteristics determined by the reporting laboratory.  This laboratory is certified under the Clinical Laboratory Improvement Amendments CLIA as qualified to perform high complexity clinical laboratory testing.    PIP/TAZO Value in next row Sensitive      <=4 SENSITIVEThis is a modified FDA-approved test that has been validated and its performance characteristics determined by the reporting laboratory.  This laboratory is certified under the Clinical Laboratory Improvement Amendments CLIA as qualified to perform high complexity clinical laboratory testing.    MEROPENEM Value in next row Sensitive      <=4 SENSITIVEThis is a modified FDA-approved test that has been validated and its performance characteristics determined by the reporting laboratory.  This laboratory is certified under the Clinical Laboratory Improvement Amendments CLIA as qualified to perform high complexity clinical laboratory testing.    * >=100,000 COLONIES/mL ESCHERICHIA COLI     Labs: Basic Metabolic Panel: Recent Labs  Lab 06/11/24 0906 06/12/24 0345 06/13/24 0248  NA 139 137 137  K 3.4* 3.6 3.6  CL 103 102 101  CO2 23 26 26   GLUCOSE 166* 122* 130*  BUN 12 14 15   CREATININE 0.60 0.69 0.63  CALCIUM 10.5* 9.9 10.5*   Liver Function Tests: Recent Labs  Lab 06/11/24 0906  AST 24   ALT 23  ALKPHOS 118  BILITOT 0.4  PROT 7.2  ALBUMIN 4.6   No results for input(s): LIPASE, AMYLASE in the last 168 hours. No results for input(s): AMMONIA in the last 168 hours. CBC: Recent Labs  Lab 06/11/24 0906 06/12/24 0345 06/13/24 0248  WBC 7.9 12.3* 13.2*  NEUTROABS 6.5  --  11.5*  HGB 15.2* 13.8 14.6  HCT 43.5 40.0 40.0  MCV 96.5 97.1 94.6  PLT 257 236 248   Cardiac Enzymes: No results for input(s): CKTOTAL, CKMB, CKMBINDEX, TROPONINI in the last 168 hours. BNP: BNP (last 3 results)  No results for input(s): BNP in the last 8760 hours.  ProBNP (last 3 results) No results for input(s): PROBNP in the last 8760 hours.  CBG: Recent Labs  Lab 06/11/24 0923  GLUCAP 148*    Signed:  Colen Grimes MD   [facility] 06/13/2024, 4:44 PM      [1]  Allergies Allergen Reactions   Amitriptyline Hcl     Opposite effect: hyperactive   Asa [Aspirin]     History of stomach ulcer   Nsaids     H/o stomach ulcer 21 yrs ago

## 2024-06-13 NOTE — Plan of Care (Signed)
  Problem: Education: Goal: Knowledge of General Education information will improve Description: Including pain rating scale, medication(s)/side effects and non-pharmacologic comfort measures Outcome: Progressing   Problem: Clinical Measurements: Goal: Ability to maintain clinical measurements within normal limits will improve Outcome: Progressing Goal: Will remain free from infection Outcome: Progressing   Problem: Activity: Goal: Risk for activity intolerance will decrease Outcome: Progressing   Problem: Coping: Goal: Level of anxiety will decrease Outcome: Progressing   Problem: Elimination: Goal: Will not experience complications related to bowel motility Outcome: Progressing   Problem: Pain Managment: Goal: General experience of comfort will improve and/or be controlled Outcome: Progressing   Problem: Safety: Goal: Ability to remain free from injury will improve Outcome: Progressing

## 2024-06-13 NOTE — Plan of Care (Signed)

## 2024-06-14 LAB — CBC WITH DIFFERENTIAL/PLATELET
Abs Immature Granulocytes: 0.05 K/uL (ref 0.00–0.07)
Basophils Absolute: 0 K/uL (ref 0.0–0.1)
Basophils Relative: 0 %
Eosinophils Absolute: 0 K/uL (ref 0.0–0.5)
Eosinophils Relative: 0 %
HCT: 40.8 % (ref 36.0–46.0)
Hemoglobin: 14.3 g/dL (ref 12.0–15.0)
Immature Granulocytes: 0 %
Lymphocytes Relative: 9 %
Lymphs Abs: 1.1 K/uL (ref 0.7–4.0)
MCH: 33.3 pg (ref 26.0–34.0)
MCHC: 35 g/dL (ref 30.0–36.0)
MCV: 94.9 fL (ref 80.0–100.0)
Monocytes Absolute: 0.8 K/uL (ref 0.1–1.0)
Monocytes Relative: 6 %
Neutro Abs: 10.3 K/uL — ABNORMAL HIGH (ref 1.7–7.7)
Neutrophils Relative %: 85 %
Platelets: 254 K/uL (ref 150–400)
RBC: 4.3 MIL/uL (ref 3.87–5.11)
RDW: 12.5 % (ref 11.5–15.5)
WBC: 12.2 K/uL — ABNORMAL HIGH (ref 4.0–10.5)
nRBC: 0 % (ref 0.0–0.2)

## 2024-06-14 LAB — BASIC METABOLIC PANEL WITH GFR
Anion gap: 10 (ref 5–15)
BUN: 21 mg/dL (ref 8–23)
CO2: 25 mmol/L (ref 22–32)
Calcium: 10.2 mg/dL (ref 8.9–10.3)
Chloride: 103 mmol/L (ref 98–111)
Creatinine, Ser: 0.59 mg/dL (ref 0.44–1.00)
GFR, Estimated: >60 mL/min
Glucose, Bld: 139 mg/dL — ABNORMAL HIGH (ref 70–99)
Potassium: 3.9 mmol/L (ref 3.5–5.1)
Sodium: 138 mmol/L (ref 135–145)

## 2024-06-14 MED ORDER — ONDANSETRON HCL 4 MG PO TABS
4.0000 mg | ORAL_TABLET | ORAL | Status: DC | PRN
  Administered 2024-06-14: 4 mg via ORAL
  Filled 2024-06-14: qty 1

## 2024-06-14 MED ORDER — ONDANSETRON HCL 4 MG PO TABS: 4.0000 mg | ORAL_TABLET | Freq: Every day | ORAL | 1 refills | Status: AC | PRN

## 2024-06-14 NOTE — Progress Notes (Signed)
 Seen examined--awaiting rpt VErtib training,  A little lightheaded with some N this am  If stabilized after lunch later this pm.   Jai Lloyde Ludlam, MD [facility] 9:57 AM

## 2024-06-14 NOTE — Progress Notes (Signed)
 Physical Therapy Treatment Patient Details Name: Rachael Webster MRN: [id number] DOB: 1945/08/12 Today's Date: 06/14/2024   History of Present Illness Pt is a 78 y/o female who presents 06/11/2024 with dizziness, nausea, and sudden balance deficits. CT negative for acute intracranial changes. Pt found to have UTI. PMH significant for recurrent UTI, HTN, osteopenia, lumbar laminectomy 2023, R RCR 1998.    PT Comments  Patient progressing with mobility and able to maintain ambulation and stair negotiation with limited symptoms post Zofran  administration and eating breakfast.  She toileted, brushed her teeth and completed seated gaze stability exercises which were issued for HEP.  She also tested negative for BPPV today with dix hallpike using bed features.  PT will continue to follow though HHPT recommended at d/c.     If plan is discharge home, recommend the following: A little help with walking and/or transfers;A little help with bathing/dressing/bathroom;Assistance with cooking/housework;Assist for transportation;Help with stairs or ramp for entrance   Can travel by private vehicle        Equipment Recommendations  None recommended by PT    Recommendations for Other Services       Precautions / Restrictions Precautions Precautions: Fall Recall of Precautions/Restrictions: Intact     Mobility  Bed Mobility Overal bed mobility: Needs Assistance Bed Mobility: Supine to Sit     Supine to sit: Used rails, Supervision, HOB elevated     General bed mobility comments: up to EOB HOB up with S; to flat bed supine with CGA    Transfers Overall transfer level: Needs assistance Equipment used: Rolling walker (2 wheels) Transfers: Sit to/from Stand Sit to Stand: Contact guard assist           General transfer comment: cues for hand placement, forward gaze    Ambulation/Gait Ambulation/Gait assistance: Contact guard assist, Supervision Gait Distance (Feet): 125  Feet Assistive device: Rolling walker (2 wheels) Gait Pattern/deviations: Decreased stride length, Step-through pattern, Shuffle       General Gait Details: cautious and cues for forward gaze at stationary target pt reports does help; some close S at times with chair follow to allow to practice steps; assisted in chair back to room   Stairs Stairs: Yes Stairs assistance: Contact guard assist Stair Management: Two rails, Step to pattern, Forwards Number of Stairs: 5 General stair comments: cues for targeting looking forward and feel step with feet while descending?   Wheelchair Mobility     Tilt Bed    Modified Rankin (Stroke Patients Only)       Balance Overall balance assessment: Needs assistance   Sitting balance-Leahy Scale: Good     Standing balance support: No upper extremity supported, During functional activity, Single extremity supported Standing balance-Leahy Scale: Fair                 High Level Balance Comments: stood to brush teeth at sink            Communication Communication Communication: No apparent difficulties  Cognition Arousal: Alert Behavior During Therapy: WFL for tasks assessed/performed   PT - Cognitive impairments: No apparent impairments                                Cueing Cueing Techniques: Verbal cues, Gestural cues  Exercises Other Exercises Other Exercises: completed seated gaze stabilization x 5 head turns then 5 head nods with cues with near target therapist held; issued for HEP Other Exercises: Access  Code: [email or web address]  URL: [email or web address]  Date: 06/14/2024  Prepared by: Montie    Exercises  - Seated Gaze Stabilization with Head Rotation  - 3 x daily - 7 x weekly  - Seated Gaze Stabilization with Head Nod  - 3 x daily - 7 x weekly    General Comments General comments (skin integrity, edema, etc.): completed dix hall pike using bed features to R and L without rotary nystagmus; noted  initial visit prior to zofran  pt with persistent L beating nystagmus; issued and practiced seated gaze stabilization x 5 reps (very slow head movements,)  Issued for HEP and encouraged to perform 3x/day and no need to practice brandt daroff initially      Pertinent Vitals/Pain Pain Assessment Pain Assessment: No/denies pain    Home Living                          Prior Function            PT Goals (current goals can now be found in the care plan section) Progress towards PT goals: Progressing toward goals    Frequency           PT Plan      Co-evaluation              AM-PAC PT 6 Clicks Mobility   Outcome Measure  Help needed turning from your back to your side while in a flat bed without using bedrails?: A Little Help needed moving from lying on your back to sitting on the side of a flat bed without using bedrails?: A Little Help needed moving to and from a bed to a chair (including a wheelchair)?: A Little Help needed standing up from a chair using your arms (e.g., wheelchair or bedside chair)?: A Little Help needed to walk in hospital room?: A Little Help needed climbing 3-5 steps with a railing? : A Little 6 Click Score: 18    End of Session Equipment Utilized During Treatment: Gait belt Activity Tolerance: Patient tolerated treatment well Patient left: in bed;with bed alarm set;with call bell/phone within reach   PT Visit Diagnosis: Unsteadiness on feet (R26.81);Difficulty in walking, not elsewhere classified (R26.2);Dizziness and giddiness (R42)     Time: 8864-8784 PT Time Calculation (min) (ACUTE ONLY): 40 min  Charges:    $Gait Training: 8-22 mins $Therapeutic Activity: 8-22 mins $Neuromuscular Re-education: 8-22 mins PT General Charges $$ ACUTE PT VISIT: 1 Visit                     Micheline Portal, PT Acute Rehabilitation Services Office:[phone] 06/14/2024    Montie Portal 06/14/2024, 1:55 PM

## 2024-06-14 NOTE — TOC Transition Note (Signed)
 Transition of Care [facility]) - Discharge Note   Patient Details  Name: Rachael Webster MRN: [id number] Date of Birth: Nov 15, 1945  [facility] CM/SW Contact:  Roxie KANDICE Stain, RN Phone Number: 06/14/2024, 9:09 AM   Clinical Narrative:     Patient stable for discharge.  Kelly with [facility] notified of discharge.  Patient to follow up with PCP.  Final next level of care: Home w Home Health Services Barriers to Discharge: Barriers Resolved   Patient Goals and CMS Choice Patient states their goals for this hospitalization and ongoing recovery are:: return home CMS Medicare.gov Compare Post Acute Care list provided to:: Patient Choice offered to / list presented to : Patient      Discharge Placement             Home          Discharge Plan and Services Additional resources added to the After Visit Summary for                            [facility] Arranged: PT [facility] Agency: [facility] Date [facility] Agency Contacted: 06/14/24 Time HH Agency Contacted: [phone] Representative spoke with at [facility] Agency: [place]  Social Drivers of Health (SDOH) Interventions SDOH Screenings   Food Insecurity: No Food Insecurity (06/11/2024)  Housing: Low Risk (06/11/2024)  Transportation Needs: No Transportation Needs (06/11/2024)  Utilities: Not At Risk (06/11/2024)  Social Connections: Moderately Isolated (06/11/2024)  Tobacco Use: Low Risk (06/11/2024)     Readmission Risk Interventions    06/14/2024    9:09 AM  Readmission Risk Prevention Plan  Post Dischage Appt Complete  Medication Screening Complete  Transportation Screening Complete

## 2024-07-07 ENCOUNTER — Telehealth: Payer: Self-pay | Admitting: Pharmacist

## 2024-07-07 NOTE — Progress Notes (Signed)
" ° °  07/07/2024  Patient ID: Rachael Webster, female   DOB: June 26, 1945, 79 y.o.   MRN: [id number]  Received message from Dr. Vernon yesterday stating, Mirtha Jain-this patient recently admitted in the hospital for vertigo, UTI.  Her UTI was treated appropriately with antibiotics.  She continues to have vertigo and balance issues.  Has home health vestibular PT twice a week as well as taking Phenergan as needed.  She is on multiple medications including gabapentin , Lamictal , Effexor , Klonopin .  Can you please review her medication.  Check interactions and possible polypharmacy.  I appreciate your help  Drug interactions: Gabapentin  + Clonazepam  + Promethazine= CNS depressants   Biggest recommendation is to get all 3 medications from 1 pharmacy!! (has [facility], [facility], and [facility] listed)  Note listed to verify Clonazepam  + Effexor  dosing with patient  Zofran  + Gabapentin - 1 refill at [facility]  ----------------------------  Per Dr. Vernon, patient wants to continue all medications. Aware of concern of potentially causing vertigo.   Called and spoke with the patient regarding medications. Reports for anything Acute= [facility]. Long-term/chronic meds= [facility] order. This was dictated by her insurance.   No longer taking Zofran  for nausea, but informed her she has another refill at [facility] if needed. Regarding Gabapentin , she will need a refill soon but will get this from [facility] for now. Will request from [facility] going forward.   For understanding of how she is taking meds: Effexor - 1 in AM + 1 late afternoon + 1 at bedtime. Working well for her this way.  Clonazepam  is only as-needed and takes 1/2 tab at night for RLS. Max of 2 per week.   Updated profile for appropriate pharmacies and how she is taking her meds. No further concerns. Also did NOT know what PT was calling regarding.   Aloysius Lewis, PharmD, [facility] Phone Number: [phone]  "

## 2024-08-31 ENCOUNTER — Ambulatory Visit: Admitting: Adult Health
